# Patient Record
Sex: Female | Born: 1985 | Race: White | Hispanic: No | Marital: Married | Smoking: Current some day smoker
Health system: Southern US, Community
[De-identification: ages and names within clinical notes are randomized; demographics above are authoritative.]

## PROBLEM LIST (undated history)

## (undated) DIAGNOSIS — F329 Major depressive disorder, single episode, unspecified: Secondary | ICD-10-CM

## (undated) DIAGNOSIS — F32A Depression, unspecified: Secondary | ICD-10-CM

## (undated) DIAGNOSIS — F419 Anxiety disorder, unspecified: Secondary | ICD-10-CM

## (undated) DIAGNOSIS — G009 Bacterial meningitis, unspecified: Secondary | ICD-10-CM

## (undated) DIAGNOSIS — G43909 Migraine, unspecified, not intractable, without status migrainosus: Secondary | ICD-10-CM

## (undated) HISTORY — DX: Major depressive disorder, single episode, unspecified: F32.9

## (undated) HISTORY — DX: Depression, unspecified: F32.A

## (undated) HISTORY — DX: Anxiety disorder, unspecified: F41.9

## (undated) HISTORY — PX: WISDOM TOOTH EXTRACTION: SHX21

## (undated) HISTORY — PX: LUMBAR PUNCTURE: SHX1985

## (undated) HISTORY — PX: EPIDURAL BLOOD PATCH: SHX1517

## (undated) HISTORY — DX: Migraine, unspecified, not intractable, without status migrainosus: G43.909

## (undated) HISTORY — PX: PHARYNGECTOMY: SUR1024

## (undated) HISTORY — DX: Bacterial meningitis, unspecified: G00.9

---
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
## 2018-02-11 ENCOUNTER — Encounter: Payer: Self-pay | Admitting: *Deleted

## 2018-02-12 ENCOUNTER — Encounter: Payer: Self-pay | Admitting: Neurology

## 2018-02-12 ENCOUNTER — Ambulatory Visit: Payer: BLUE CROSS/BLUE SHIELD | Admitting: Neurology

## 2018-02-12 VITALS — BP 130/85 | HR 81 | Ht 64.0 in | Wt 189.0 lb

## 2018-02-12 DIAGNOSIS — R7309 Other abnormal glucose: Secondary | ICD-10-CM

## 2018-02-12 DIAGNOSIS — H5462 Unqualified visual loss, left eye, normal vision right eye: Secondary | ICD-10-CM | POA: Diagnosis not present

## 2018-02-12 DIAGNOSIS — E669 Obesity, unspecified: Secondary | ICD-10-CM | POA: Diagnosis not present

## 2018-02-12 DIAGNOSIS — G932 Benign intracranial hypertension: Secondary | ICD-10-CM | POA: Diagnosis not present

## 2018-02-12 DIAGNOSIS — R519 Headache, unspecified: Secondary | ICD-10-CM

## 2018-02-12 DIAGNOSIS — R51 Headache: Secondary | ICD-10-CM | POA: Diagnosis not present

## 2018-02-12 DIAGNOSIS — G8929 Other chronic pain: Secondary | ICD-10-CM

## 2018-02-12 DIAGNOSIS — H4710 Unspecified papilledema: Secondary | ICD-10-CM

## 2018-02-12 MED ORDER — HYDROXYZINE HCL 10 MG PO TABS: ORAL_TABLET | ORAL | 0 refills | Status: DC

## 2018-02-12 NOTE — Progress Notes (Signed)
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 female here as requested by Dr. Abel PrestoYoakum for visual field defect.  Past medical history migraine, depression, anxiety.She has frequent headaches and migraines since having meningitis at the age of 32. She started having more frequent headaches and went to the eye doctor. Aunt has migraines. The headaches can feel like pressure. They can be pulsating/pounding and she can have nausea, vomiting, light and sound may bother her or may not. She has daily headaches. She has pressure every day. She has migraine days at least 8 a month. She vomits often. Movement makes it worse. No IUD, no vit A, no antibiotic long term use. She is obese. The headaches can be severe. Wakes up with headaches, continuous, unclear triggers.   Reviewed notes, labs and imaging from outside physicians, which showed:  Reviewed notes from my eye doctor in [place].  Patient's been seen multiple times in the last several months.  Patient [facility] visual fields[facility].  No complaints reported of physical ocular symptoms.  Not experiencing routine headaches or double vision.  No reports of visual floaters or light flashes.  In addition not experiencing blurry or uncomfortable vision.  Patient does have a history of migraines.  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  She was referred to neurology.  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  Review of Systems: Patient complains of symptoms  per HPI as well as the following symptoms: Headache, depression, anxiety, loss of vision, eye pain, aching muscles, allergies, weight gain, fatigue. Pertinent negatives and positives per HPI. All others negative.   Social History   Socioeconomic History  . Marital status: Married    Spouse name: Not on file  . Number of children: 0  . Years of education: 5214  . Highest education level: Associate degree: occupational, Scientist, product/process developmenttechnical, or vocational program  Occupational History  . Not on file  Social Needs  . Financial resource strain: Not on file  . Food insecurity:    Worry: Not on file    Inability: Not on file  . Transportation needs:    Medical: Not on file    Non-medical: Not on file  Tobacco Use  . Smoking status: Current Some Day Smoker    Types: Cigarettes  . Smokeless tobacco: Never Used  . Tobacco comment: social/stress smoke  Substance and Sexual Activity  . Alcohol use: Yes    Comment: might have a glass of wine here or there. Stopped regular drinking in her 3420s.  . Drug use: Never  . Sexual activity: Not on file  Lifestyle  . Physical activity:    Days per week: Not on file    Minutes per session: Not on file  . Stress: Not on file  Relationships  . Social connections:    Talks on phone: Not on file    Gets together: Not on file    Attends religious service: Not on file    Active member of club or organization: Not on file    Attends meetings of clubs  or organizations: Not on file    Relationship status: Not on file  . Intimate partner violence:    Fear of current or ex partner: Not on file    Emotionally abused: Not on file    Physically abused: Not on file    Forced sexual activity: Not on file  Other Topics Concern  . Not on file  Social History Narrative   Lives at home with her husband   Right handed   Caffeine: coffee 3-4 cups per day    Family History  Problem Relation Age of Onset  . Glaucoma Other        women on paternal side  . Migraines  Paternal Aunt   . Pseudotumor cerebri Neg Hx     Past Medical History:  Diagnosis Date  . Anxiety   . Bacterial meningitis 2004  . Depression   . Migraine     Past Surgical History:  Procedure Laterality Date  . PHARYNGECTOMY     x2  . WISDOM TOOTH EXTRACTION      Current Outpatient Medications  Medication Sig Dispense Refill  . hydrOXYzine (ATARAX/VISTARIL) 10 MG tablet Take 1 tablet 30-60 minutes before procedure. May repeat once if needed. 10 tablet 0   No current facility-administered medications for this visit.     Allergies as of 02/12/2018 - Review Complete 02/12/2018  Allergen Reaction Noted  . Other  02/11/2018  . Valium [diazepam]  02/11/2018  . Penicillins Rash 02/12/2018    Vitals: BP 130/85 (BP Location: Right Arm, Patient Position: Sitting)   Pulse 81   Ht 5\' 4"  (1.626 m)   Wt 189 lb (85.7 kg)   LMP 02/08/2018 (Exact Date)   BMI 32.44 kg/m  Last Weight:  Wt Readings from Last 1 Encounters:  02/12/18 189 lb (85.7 kg)   Last Height:   Ht Readings from Last 1 Encounters:  02/12/18 5\' 4"  (1.626 m)   Physical exam: Exam: Gen: NAD, conversant, well nourised, obese, well groomed                     CV: RRR, no MRG. No Carotid Bruits. No peripheral edema, warm, nontender Eyes: Conjunctivae clear without exudates or hemorrhage  Neuro: Detailed Neurologic Exam  Speech:    Speech is normal; fluent and spontaneous with normal comprehension.  Cognition:    The patient is oriented to person, place, and time;     recent and remote memory intact;     language fluent;     normal attention, concentration,     fund of knowledge Cranial Nerves:    The pupils are equal, round, and reactive to light. +papilledema +1 Visual fields are full to finger confrontation. Extraocular movements are intact. Trigeminal sensation is intact and the muscles of mastication are normal. The face is symmetric. The palate elevates in the midline. Hearing intact. Voice is  normal. Shoulder shrug is normal. The tongue has normal motion without fasciculations.   Coordination:    Normal finger to nose and heel to shin. Normal rapid alternating movements.   Gait:    Heel-toe and tandem gait are normal.   Motor Observation:    No asymmetry, no atrophy, and no involuntary movements noted. Tone:    Normal muscle tone.    Posture:    Posture is normal. normal erect    Strength:    Strength is V/V in the upper and lower limbs.      Sensation: intact to LT  Reflex Exam:  DTR's:    Deep tendon reflexes in the upper and lower extremities are normal bilaterally.   Toes:    The toes are downgoing bilaterally.   Clonus:    Clonus is absent.     Assessment/Plan:  32 y/o with PMHx migraines, obesity, meningitis with papilledema. Need evaluation for IIH or other etiologies.   MRI brain w/wo contrast MRV of the brain Lumbar puncture Obesity: losing weight is critical. Healthy weight and wellness center Discussed IIH and risk of permanent vision loss, go to ED acutely if needed.   Orders Placed This Encounter  Procedures  . MR BRAIN W WO CONTRAST  . MR MRV HEAD WO CM  . DG FLUORO GUIDED LOC OF NEEDLE/CATH TIP FOR SPINAL INJECT LT  . Comprehensive metabolic panel  . CBC  . Thyroid Panel With TSH  . Hemoglobin A1c  . Ambulatory referral to Family Practice     Discussed: To prevent or relieve headaches, try the following: Cool Compress. Lie down and place a cool compress on your head.  Avoid headache triggers. If certain foods or odors seem to have triggered your migraines in the past, avoid them. A headache diary might help you identify triggers.  Include physical activity in your daily routine. Try a daily walk or other moderate aerobic exercise.  Manage stress. Find healthy ways to cope with the stressors, such as delegating tasks on your to-do list.  Practice relaxation techniques. Try deep breathing, yoga, massage and visualization.  Eat  regularly. Eating regularly scheduled meals and maintaining a healthy diet might help prevent headaches. Also, drink plenty of fluids.  Follow a regular sleep schedule. Sleep deprivation might contribute to headaches Consider biofeedback. With this mind-body technique, you learn to control certain bodily functions - such as muscle tension, heart rate and blood pressure - to prevent headaches or reduce headache pain.    Proceed to emergency room if you experience new or worsening symptoms or symptoms do not resolve, if you have new neurologic symptoms or if headache is severe, or for any concerning symptom.   Provided education and documentation from American headache Society toolbox including articles on: chronic migraine medication overuse headache, chronic migraines, prevention of migraines, behavioral and other nonpharmacologic treatments for headache.  Cc: Dr. Frederico Hamman, MD  [facility] [address] [place], [place] [address]  Phone [phone] Fax [phone]

## 2018-02-12 NOTE — Patient Instructions (Addendum)
MRI brain w/wo contrast MRV head  Lumbar puncture  Acetazolamide if IIH confirmed   Idiopathic Intracranial Hypertension Idiopathic intracranial hypertension (IIH) is a condition that increases pressure around the brain. The fluid that surrounds the brain and spinal cord (cerebrospinal fluid, CSF) increases and causes the pressure. Idiopathic means that the cause of this condition is not known. IIH affects the brain and spinal cord (is a neurological disorder). If this condition is not treated, it can cause vision loss or blindness. What increases the risk? You are more likely to develop this condition if:  You are severely overweight (obese).  You are a woman who has not gone through menopause.  You take certain medicines, such as birth control or steroids.  What are the signs or symptoms? Symptoms of IIH include:  Headaches. This is the most common symptom.  Pain in the shoulders or neck.  Nausea and vomiting.  A "rushing water" or pulsing sound within the ears (pulsatile tinnitus).  Double vision.  Blurred vision.  Brief episodes of complete vision loss.  How is this diagnosed? This condition may be diagnosed based on:  Your symptoms.  Your medical history.  CT scan of the brain.  MRI of the brain.  Magnetic resonance venogram (MRV) to check veins in the brain.  Diagnostic lumbar puncture. This is a procedure to remove and examine a sample of cerebrospinal fluid. This procedure can determine whether too much fluid may be causing IIH.  A thorough eye exam to check for swelling or nerve damage in the eyes.  How is this treated? Treatment for this condition depends on your symptoms. The goal of treatment is to decrease the pressure around your brain. Common treatments include:  Medicines to decrease the production of spinal fluid and lower the pressure within your skull.  Medicines to prevent or treat headaches.  Surgery to place drains (shunts) in your  brain to remove excess fluid.  Lumbar puncture to remove excess cerebrospinal fluid.  Follow these instructions at home:  If you are overweight or obese, work with your health care provider to lose weight.  Take over-the-counter and prescription medicines only as told by your health care provider.  Do not drive or use heavy machinery while taking medicines that can make you sleepy.  Keep all follow-up visits as told by your health care provider. This is important. Contact a health care provider if:  You have changes in your vision, such as: ? Double vision. ? Not being able to see colors (color vision). Get help right away if:  You have any of the following symptoms and they get worse or do not get better. ? Headaches. ? Nausea. ? Vomiting. ? Vision changes or difficulty seeing. Summary  Idiopathic intracranial hypertension (IIH) is a condition that increases pressure around the brain. The cause is not known (is idiopathic).  The most common symptom of IIH is headaches.  Treatment may include medicines or surgery to relieve the pressure on your brain. This information is not intended to replace advice given to you by your health care provider. Make sure you discuss any questions you have with your health care provider. Document Released: 04/30/2001 Document Revised: 01/11/2016 Document Reviewed: 01/11/2016 Elsevier Interactive Patient Education  2017 Elsevier Inc.  Acetazolamide tablets What is this medicine? ACETAZOLAMIDE (a set a ZOLE a mide) is used to treat glaucoma and some seizure disorders. It may be used to treat edema or swelling from heart failure or from other medicines. This medicine is also  used to treat and to prevent altitude or mountain sickness. This medicine may be used for other purposes; ask your health care provider or pharmacist if you have questions. COMMON BRAND NAME(S): Diamox What should I tell my health care provider before I take this  medicine? They need to know if you have any of these conditions: -diabetes -kidney disease -liver disease -lung disease -an unusual or allergic reaction to acetazolamide, sulfa drugs, other medicines, foods, dyes, or preservatives -pregnant or trying to get pregnant -breast-feeding How should I use this medicine? Take this medicine by mouth with a glass of water. Follow the directions on the prescription label. Take this medicine with food if it upsets your stomach. Take your doses at regular intervals. Do not take your medicine more often than directed. Do not stop taking except on your doctor's advice. Talk to your pediatrician regarding the use of this medicine in children. Special care may be needed. Patients over 69 years old may have a stronger reaction and need a smaller dose. Overdosage: If you think you have taken too much of this medicine contact a poison control center or emergency room at once. NOTE: This medicine is only for you. Do not share this medicine with others. What if I miss a dose? If you miss a dose, take it as soon as you can. If it is almost time for your next dose, take only that dose. Do not take double or extra doses. What may interact with this medicine? Do not take this medicine with any of the following medications: -methazolamide This medicine may also interact with the following medications: -aspirin and aspirin-like medicines -cyclosporine -lithium -medicine for diabetes -methenamine -other diuretics -phenytoin -primidone -quinidine -sodium bicarbonate -stimulant medicines like dextroamphetamine This list may not describe all possible interactions. Give your health care provider a list of all the medicines, herbs, non-prescription drugs, or dietary supplements you use. Also tell them if you smoke, drink alcohol, or use illegal drugs. Some items may interact with your medicine. What should I watch for while using this medicine? Visit your doctor or  health care professional for regular checks on your progress. You will need blood work done regularly. If you are diabetic, check your blood sugar as directed. You may need to be on a special diet while taking this medicine. Ask your doctor. Also, ask how many glasses of fluid you need to drink a day. You must not get dehydrated. You may get drowsy or dizzy. Do not drive, use machinery, or do anything that needs mental alertness until you know how this medicine affects you. Do not stand or sit up quickly, especially if you are an older patient. This reduces the risk of dizzy or fainting spells. This medicine can make you more sensitive to the sun. Keep out of the sun. If you cannot avoid being in the sun, wear protective clothing and use sunscreen. Do not use sun lamps or tanning beds/booths. What side effects may I notice from receiving this medicine? Side effects that you should report to your doctor or health care professional as soon as possible: -allergic reactions like skin rash, itching or hives, swelling of the face, lips, or tongue -breathing problems -confusion, depression -dark urine -fever -numbness, tingling in hands or feet -redness, blistering, peeling or loosening of the skin, including inside the mouth -ringing in the ears -seizures -unusually weak or tired -yellowing of the eyes or skin Side effects that usually do not require medical attention (report to your doctor or  health care professional if they continue or are bothersome): -change in taste -diarrhea -headache -loss of appetite -nausea, vomiting -passing urine more often This list may not describe all possible side effects. Call your doctor for medical advice about side effects. You may report side effects to FDA at 1-800-FDA-1088. Where should I keep my medicine? Keep out of the reach of children. Store at room temperature between 20 and 25 degrees C (68 and 77 degrees F). Throw away any unused medicine after the  expiration date. NOTE: This sheet is a summary. It may not cover all possible information. If you have questions about this medicine, talk to your doctor, pharmacist, or health care provider.  2018 Elsevier/Gold Standard (2007-05-14 10:59:40)   Migraine Headache A migraine headache is a very strong throbbing pain on one side or both sides of your head. Migraines can also cause other symptoms. Talk with your doctor about what things may bring on (trigger) your migraine headaches. Follow these instructions at home: Medicines  Take over-the-counter and prescription medicines only as told by your doctor.  Do not drive or use heavy machinery while taking prescription pain medicine.  To prevent or treat constipation while you are taking prescription pain medicine, your doctor may recommend that you: ? Drink enough fluid to keep your pee (urine) clear or pale yellow. ? Take over-the-counter or prescription medicines. ? Eat foods that are high in fiber. These include fresh fruits and vegetables, whole grains, and beans. ? Limit foods that are high in fat and processed sugars. These include fried and sweet foods. Lifestyle  Avoid alcohol.  Do not use any products that contain nicotine or tobacco, such as cigarettes and e-cigarettes. If you need help quitting, ask your doctor.  Get at least 8 hours of sleep every night.  Limit your stress. General instructions   Keep a journal to find out what may bring on your migraines. For example, write down: ? What you eat and drink. ? How much sleep you get. ? Any change in what you eat or drink. ? Any change in your medicines.  If you have a migraine: ? Avoid things that make your symptoms worse, such as bright lights. ? It may help to lie down in a dark, quiet room. ? Do not drive or use heavy machinery. ? Ask your doctor what activities are safe for you.  Keep all follow-up visits as told by your doctor. This is important. Contact a doctor  if:  You get a migraine that is different or worse than your usual migraines. Get help right away if:  Your migraine gets very bad.  You have a fever.  You have a stiff neck.  You have trouble seeing.  Your muscles feel weak or like you cannot control them.  You start to lose your balance a lot.  You start to have trouble walking.  You pass out (faint). This information is not intended to replace advice given to you by your health care provider. Make sure you discuss any questions you have with your health care provider. Document Released: 11/29/2007 Document Revised: 09/09/2015 Document Reviewed: 08/08/2015 Elsevier Interactive Patient Education  2018 [employer].

## 2018-02-13 ENCOUNTER — Encounter: Payer: Self-pay | Admitting: Neurology

## 2018-02-13 ENCOUNTER — Telehealth: Payer: Self-pay | Admitting: *Deleted

## 2018-02-13 ENCOUNTER — Telehealth: Payer: Self-pay | Admitting: Neurology

## 2018-02-13 DIAGNOSIS — R51 Headache: Secondary | ICD-10-CM

## 2018-02-13 DIAGNOSIS — H4710 Unspecified papilledema: Secondary | ICD-10-CM | POA: Insufficient documentation

## 2018-02-13 DIAGNOSIS — R519 Headache, unspecified: Secondary | ICD-10-CM | POA: Insufficient documentation

## 2018-02-13 LAB — COMPREHENSIVE METABOLIC PANEL
ALT: 15 IU/L (ref 0–32)
AST: 16 IU/L (ref 0–40)
Albumin/Globulin Ratio: 1.7 (ref 1.2–2.2)
Albumin: 4.5 g/dL (ref 3.5–5.5)
Alkaline Phosphatase: 76 IU/L (ref 39–117)
BUN/Creatinine Ratio: 11 (ref 9–23)
BUN: 8 mg/dL (ref 6–20)
CO2: 20 mmol/L (ref 20–29)
CREATININE: 0.73 mg/dL (ref 0.57–1.00)
Calcium: 9.6 mg/dL (ref 8.7–10.2)
Chloride: 106 mmol/L (ref 96–106)
GFR calc non Af Amer: 110 mL/min/{1.73_m2} (ref >59)
GFR, EST AFRICAN AMERICAN: 127 mL/min/{1.73_m2} (ref >59)
Globulin, Total: 2.6 g/dL (ref 1.5–4.5)
Glucose: 80 mg/dL (ref 65–99)
Potassium: 4.7 mmol/L (ref 3.5–5.2)
Sodium: 141 mmol/L (ref 134–144)
Total Protein: 7.1 g/dL (ref 6.0–8.5)

## 2018-02-13 LAB — CBC
Hematocrit: 44.6 % (ref 34.0–46.6)
Hemoglobin: 14.6 g/dL (ref 11.1–15.9)
MCH: 27.8 pg (ref 26.6–33.0)
MCHC: 32.7 g/dL (ref 31.5–35.7)
MCV: 85 fL (ref 79–97)
Platelets: 292 10*3/uL (ref 150–450)
RBC: 5.25 x10E6/uL (ref 3.77–5.28)
RDW: 12.5 % (ref 12.3–15.4)
WBC: 5.8 10*3/uL (ref 3.4–10.8)

## 2018-02-13 LAB — HEMOGLOBIN A1C
Est. average glucose Bld gHb Est-mCnc: 114 mg/dL
Hgb A1c MFr Bld: 5.6 % (ref 4.8–5.6)

## 2018-02-13 LAB — THYROID PANEL WITH TSH
Free Thyroxine Index: 2.1 (ref 1.2–4.9)
T3 Uptake Ratio: 27 % (ref 24–39)
T4, Total: 7.6 ug/dL (ref 4.5–12.0)
TSH: 2.09 u[IU]/mL (ref 0.450–4.500)

## 2018-02-13 NOTE — Telephone Encounter (Signed)
[employer] Auth: [id number] (exp. 02/13/18 to 03/14/18) patient is scheduled at GI for 02/27/18.

## 2018-02-13 NOTE — Telephone Encounter (Signed)
Spoke with patient and informed her that her lab results are normal. Patient verbalized understanding, appreciation.

## 2018-02-17 NOTE — Telephone Encounter (Signed)
Patient is scheduled at GI for 02/12/18

## 2018-02-27 ENCOUNTER — Ambulatory Visit
Admission: RE | Admit: 2018-02-27 | Discharge: 2018-02-27 | Disposition: A | Discharge status: Home / Self Care | Payer: BLUE CROSS/BLUE SHIELD | Source: Ambulatory Visit | Attending: Neurology | Admitting: Neurology

## 2018-02-27 DIAGNOSIS — H4710 Unspecified papilledema: Secondary | ICD-10-CM

## 2018-02-27 DIAGNOSIS — G8929 Other chronic pain: Secondary | ICD-10-CM

## 2018-02-27 DIAGNOSIS — R51 Headache: Secondary | ICD-10-CM

## 2018-02-27 DIAGNOSIS — H5462 Unqualified visual loss, left eye, normal vision right eye: Secondary | ICD-10-CM

## 2018-02-27 DIAGNOSIS — R519 Headache, unspecified: Secondary | ICD-10-CM

## 2018-02-27 MED ORDER — GADOBENATE DIMEGLUMINE 529 MG/ML IV SOLN
17.0000 mL | Freq: Once | INTRAVENOUS | Status: AC | PRN
  Administered 2018-02-27: 17 mL via INTRAVENOUS

## 2018-02-28 ENCOUNTER — Ambulatory Visit
Admission: RE | Admit: 2018-02-28 | Discharge: 2018-02-28 | Disposition: A | Discharge status: Home / Self Care | Payer: BLUE CROSS/BLUE SHIELD | Source: Ambulatory Visit | Attending: Neurology | Admitting: Neurology

## 2018-02-28 DIAGNOSIS — R51 Headache: Principal | ICD-10-CM

## 2018-02-28 DIAGNOSIS — G8929 Other chronic pain: Secondary | ICD-10-CM

## 2018-02-28 DIAGNOSIS — R519 Headache, unspecified: Secondary | ICD-10-CM

## 2018-02-28 DIAGNOSIS — H5462 Unqualified visual loss, left eye, normal vision right eye: Secondary | ICD-10-CM

## 2018-02-28 DIAGNOSIS — G932 Benign intracranial hypertension: Secondary | ICD-10-CM

## 2018-02-28 DIAGNOSIS — E669 Obesity, unspecified: Secondary | ICD-10-CM

## 2018-02-28 DIAGNOSIS — H4710 Unspecified papilledema: Secondary | ICD-10-CM

## 2018-02-28 LAB — GLUCOSE, CSF: Glucose, CSF: 67 mg/dL (ref 40–80)

## 2018-02-28 LAB — GRAM STAIN
MICRO NUMBER: 91544736
SPECIMEN QUALITY:: ADEQUATE

## 2018-02-28 LAB — CSF CELL COUNT WITH DIFFERENTIAL
RBC Count, CSF: 1 cells/uL (ref 0–10)
WBC, CSF: 1 cells/uL (ref 0–5)

## 2018-02-28 LAB — PROTEIN, CSF: TOTAL PROTEIN, CSF: 19 mg/dL (ref 15–45)

## 2018-02-28 NOTE — Discharge Instructions (Signed)

## 2018-03-03 ENCOUNTER — Telehealth: Payer: Self-pay | Admitting: *Deleted

## 2018-03-03 ENCOUNTER — Other Ambulatory Visit: Payer: Self-pay | Admitting: *Deleted

## 2018-03-03 ENCOUNTER — Telehealth: Payer: Self-pay | Admitting: Neurology

## 2018-03-03 DIAGNOSIS — G971 Other reaction to spinal and lumbar puncture: Secondary | ICD-10-CM

## 2018-03-03 NOTE — Telephone Encounter (Signed)
Spoke with patient and advised her that Dr. Frances FurbishAthar has authorized an order for a blood patch and this was placed. Advised pt to call GI asap to get on the schedule. Informed her that if she is unable to get it and she gets worse to go to the ED as they can do it in the hospital if needed. Advised pt to call us back if needed and to also please call when she is better so we can order the Diamox. Pt verbalized appreciation and understanding.

## 2018-03-03 NOTE — Progress Notes (Signed)
Blood patch order.

## 2018-03-03 NOTE — Telephone Encounter (Signed)
Labs on her spinal fluid were unremarkable, her pressure was indeed elevated at 33. She can start the Diamox when her nausea and vomiting are better. We can try Zofran for now, but if she does have a significant positional component and headache with standing up she may need a blood patch at this point. [facility].  Please advise patient that she could try nausea medicine or if she has severe symptoms we may want to go ahead with a blood patch.

## 2018-03-03 NOTE — Telephone Encounter (Signed)
Spoke with patient and discussed the recommendations from Dr. Frances FurbishAthar. Pt stated that she doesn't think the nausea medication would work. She said she is not feeling sick to her stomach but she when she gets up and moves around she gets sick. Pt said that she doesn't want to but if she needs the blood patch then she is fine with that. She is aware that if Dr. Frances FurbishAthar agrees to order the blood patch pt can call GI and ask to be scheduled for the blood patch. She verbalized appreciation and will wait for call back from our office with next steps.   Also, pt doesn't have a prescription for the diamox yet.

## 2018-03-03 NOTE — Telephone Encounter (Signed)
Called pt and discussed results. Informed pt that her MRV showed no blockage in the veins of the head. There was some narrowing of the veins in the back of that head that sometimes occurs if spinal fluid pressure is high (which we confirmed on the LP that she had after the MRI). Advised pt that her MRI of the brain showed some flattening of the pituitary gland that sometimes happens when spinal fluid pressure is high.   She also has some mild chronic sinusitis and some fluid in the mastoids (near the left ear). Advised pt that if  she has a stopped up sensation in her left ear she can try OTC nasal Flonase which sometimes helps reduce the small amount of fluid. Pt stated that has that sensation in the morning but it "pops open" during the day. RN advised pt that we can call in some Azithromycin if her sinuses are bothering her. She said she would like to hold off on that.  Patient verbalized understanding and appreciation for the results and advice. She had no further concerns. Her blood patch will be tomorrow. She is aware to please call back when she is better from her post-LP symptoms of headache and nausea and we will start Diamox.    --------------------- Notes recorded by Asa LenteSater, Richard A, MD on 02/28/2018 at 12:11 PM EST Please let her know that there is no blockage in the veins of the head. There was some narrowing of the veins in the back of the head that sometimes occurs if spinal fluid pressure is high. She is having a lumbar puncture today to determine if the pressure is elevated.  Notes recorded by Dr. 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   She also has some mild chronic sinusitis and some fluid in the mastoids (near the left ear).  Please call in azithromycin 250 mg tablets #6.  Take 2 p.o. the first day then 1 p.o. qd for more days until finished. If she has a  stopped up sensation in her left ear she can try OTC nasal Flonase which sometimes helps reduce the small amount of fluid.

## 2018-03-03 NOTE — Telephone Encounter (Signed)
Spoke with Dr. Frances FurbishAthar as Dr. Lucia GaskinsAhern is currently out of the office. Dr. Frances FurbishAthar recommends that pt increase her oral intake, drink caffeine (strong coffee, diet soda) and continue bedrest. Hold on starting Diamox for now. Possibly start in a few days as pressure was elevated at 33. Will have pt call back on Thursday if doing well or can call tomorrow if not better and speak with on-call MD.   Sherron MondaySpoke with pt and informed of the above plan. Patient verbalized understanding and stated that she had already increased her oral intake and has been drinking coffee. However when she gets up to go to the bathroom she gets sick and is unable to keep the fluids down. Pt stated that she does have a headache however it is not throbbing. RN advised that she would let Dr. Frances FurbishAthar know and call her back. Pt appreciative.

## 2018-03-03 NOTE — Telephone Encounter (Signed)
-----   Message from Judi Congasey C Bruno, RN sent at 03/03/2018  5:35 PM EST -----  ----- Message ----- From: Judi CongBruno, Casey C, RN Sent: 03/03/2018   2:51 PM EST To: Bertram SavinBethany L Leib Elahi, RN   ----- Message ----- From: Asa LenteSater, Richard A, MD Sent: 02/28/2018  12:18 PM EST To: Judi Congasey C Bruno, RN  Please let her know that the MRI of the brain showed some flattening of the pituitary gland that sometimes happens when spinal fluid pressure is high.   She also has some mild chronic sinusitis and some fluid in the mastoids (near the left ear).  Please call in azithromycin 250 mg tablets #6.  Take 2 p.o. the first day then 1 p.o. qd for more days until finished. If she has a stopped up sensation in her left ear she can try OTC nasal Flonase which sometimes helps reduce the small amount of fluid.

## 2018-03-03 NOTE — Telephone Encounter (Signed)
Pt said since spinal tap on Friday 02/28/18 she has been n&v with HA. She is unable to perform any daily activities. As long as lying down she is ok. Please call to advise

## 2018-03-04 ENCOUNTER — Ambulatory Visit
Admission: RE | Admit: 2018-03-04 | Discharge: 2018-03-04 | Disposition: A | Discharge status: Home / Self Care | Payer: BLUE CROSS/BLUE SHIELD | Source: Ambulatory Visit | Attending: Neurology | Admitting: Neurology

## 2018-03-04 DIAGNOSIS — G971 Other reaction to spinal and lumbar puncture: Secondary | ICD-10-CM

## 2018-03-04 MED ORDER — IOPAMIDOL (ISOVUE-M 200) INJECTION 41%
1.0000 mL | Freq: Once | INTRAMUSCULAR | Status: AC
  Administered 2018-03-04: 1 mL via EPIDURAL

## 2018-03-04 NOTE — Progress Notes (Signed)
Blood obtained from pt's R AC for blood patch. Pt tolerated procedure well. Site is unremarkable. 

## 2018-03-04 NOTE — Discharge Instructions (Signed)

## 2018-03-07 NOTE — Telephone Encounter (Signed)
Pt called to inform RN that she is doing better just has a little bit of nausea left. She is ready to start medications for her spinal pressures. Please advise.

## 2018-03-10 NOTE — Addendum Note (Signed)
Addended by: Guy Begin on: 03/10/2018 03:40 PM   Modules accepted: Orders

## 2018-03-10 NOTE — Telephone Encounter (Signed)
Side Effects. Dizziness, lightheadedness, or increased urination may occur, especially during the first few days as your body adjusts to the medication. Blurred vision, dry mouth, drowsiness, loss of appetite, stomach upset, headache and tiredness may also occur.

## 2018-03-10 NOTE — Telephone Encounter (Signed)
Spoke to pt and she stated that she is taking OTC NSAid ibuprofen and ice for back pain,has sharp shooting pains  sits up. Asking for non-narcotic wo help with this.  Blood patch done last Tuesday.  Sx of light headedness, nausea, vomiting has gotten better.  Ok to start generic diamox.   Went over side effects. Dizziness, lightheadedness, or increased urination may occur, especially during the first few days as your body adjusts to the medication. Blurred vision, dry mouth, drowsiness, loss of appetite, stomach upset, headache and tiredness may also occur.

## 2018-03-10 NOTE — Telephone Encounter (Signed)
Ok to start diamox?

## 2018-03-10 NOTE — Telephone Encounter (Signed)
Pt has called back because she states she was not contacted by RN

## 2018-03-10 NOTE — Telephone Encounter (Signed)
Recommend ibuprofen or tylenol for now. -VRP

## 2018-03-10 NOTE — Telephone Encounter (Signed)
Ok to start acetazolamide 250mg  twice a day. Will increase after 1-2 weeks. -VRP

## 2018-03-11 MED ORDER — ACETAZOLAMIDE 250 MG PO TABS: 250.0000 mg | ORAL_TABLET | Freq: Two times a day (BID) | ORAL | 0 refills | Status: DC

## 2018-03-11 NOTE — Addendum Note (Signed)
Addended by: Guy Begin on: 03/11/2018 09:06 AM   Modules accepted: Orders

## 2018-03-11 NOTE — Telephone Encounter (Signed)
Spoke to pt and relayed that per Dr. Marjory Lies continue with tylenol and motrin for pain.  She states that she can not sit for extended time, only 2-3 hours at at time at work the it becomes unbearable.  She will call [facility] about using heat instead of ice on LP site now.  She picked up diamox 250mg  po bid today and will start this.  She is still asking for other type nsaid (non narcotic ) for her back pain.  Is there something you recommend?

## 2018-03-11 NOTE — Telephone Encounter (Signed)
Laura Freeman, we really need to take a look at her back and make sure there is no infection as it is unusual to have this much pain. Does Eber Jones or Aundra Millet have an open appointment this week?

## 2018-03-11 NOTE — Telephone Encounter (Signed)
If she is still having back pain after the LP or signs of LP headache please discuss with patient and call over to [facility] to see what we can do, would they want to see her to ensure the LP site isn;t infected or would they want to try another blood patch? Let me know thanks.

## 2018-03-12 ENCOUNTER — Telehealth: Payer: Self-pay | Admitting: Nurse Practitioner

## 2018-03-12 ENCOUNTER — Encounter: Payer: Self-pay | Admitting: Nurse Practitioner

## 2018-03-12 ENCOUNTER — Ambulatory Visit (INDEPENDENT_AMBULATORY_CARE_PROVIDER_SITE_OTHER): Payer: BLUE CROSS/BLUE SHIELD | Admitting: Nurse Practitioner

## 2018-03-12 VITALS — BP 149/90 | HR 99 | Ht 64.0 in | Wt 187.8 lb

## 2018-03-12 DIAGNOSIS — M545 Low back pain, unspecified: Secondary | ICD-10-CM

## 2018-03-12 DIAGNOSIS — M549 Dorsalgia, unspecified: Secondary | ICD-10-CM | POA: Insufficient documentation

## 2018-03-12 DIAGNOSIS — R519 Headache, unspecified: Secondary | ICD-10-CM

## 2018-03-12 DIAGNOSIS — H4710 Unspecified papilledema: Secondary | ICD-10-CM

## 2018-03-12 DIAGNOSIS — R51 Headache: Secondary | ICD-10-CM

## 2018-03-12 DIAGNOSIS — R269 Unspecified abnormalities of gait and mobility: Secondary | ICD-10-CM | POA: Insufficient documentation

## 2018-03-12 MED ORDER — PREDNISONE 10 MG PO TABS: 10.0000 mg | ORAL_TABLET | Freq: Every day | ORAL | 0 refills | Status: DC

## 2018-03-12 NOTE — Telephone Encounter (Signed)
Spoke to pt this am, per Dr. Lucia Gaskins, make appt with NP, back pain after 12/27, 12/31 LP/Blood patch.  This is 3 wks reck, has appt 03/18/18 with Dr. Lucia Gaskins, may need to be canceled after appt today.

## 2018-03-12 NOTE — Patient Instructions (Signed)
Continue Diamox 250 mg 2 times daily Prednisone 6-day Dosepak take as directed MRI of the lumbar spine rule out spinal leak hematoma constant back pain after LP gait abnormality Follow-up with Dr. Lucia Gaskins

## 2018-03-12 NOTE — Progress Notes (Signed)
[facility]  PATIENT: Laura Freeman DOB: 07-Aug-1985   REASON FOR VISIT: follow up for back pain, papilledema left eye HISTORY FROM:patient and husband     HISTORY OF PRESENT ILLNESS:UPDATE 1/8/2020CM Ms. 32, 33 year old female returns for follow-up.  She is originally evaluated by Dr. Lucia Gaskins 02/12/2018 for chronic daily headaches and papilledema.  She had an LP with opening pressure of 33 on 02/28/2018.  She immediately began to complain with back pain and had blood patch on 03/04/2018.  She has used ice to the area but ibuprofen does not help she states her back pain is constant.  She is having difficulty walking.  She has difficulty sitting up she feels better laying down.  She denies loss of bowel or bladder control.  She was just started on Diamox currently taking 250 mg twice a day.  She returns for reevaluation.  12/11/19[name] is a 33 y.o. female here as requested by Dr. Abel Presto for visual field defect.  Past medical history migraine, depression, anxiety.She has frequent headaches and migraines since having meningitis at the age of 77. She started having more frequent headaches and went to the eye doctor. Aunt has migraines. The headaches can feel like pressure. They can be pulsating/pounding and she can have nausea, vomiting, light and sound may bother her or may not. She has daily headaches. She has pressure every day. She has migraine days at least 8 a month. She vomits often. Movement makes it worse. No IUD, no vit A, no antibiotic long term use. She is obese. The headaches can be severe. Wakes up with headaches, continuous, unclear triggers.   Reviewed notes, labs and imaging from outside physicians, which showed:  Reviewed notes from my eye doctor in [place].  Patient's been seen multiple times in the last several months.  Patient last return for visual fields, OCT NP [facility] secondary suspicion of glaucoma.  No complaints reported of physical  ocular symptoms.  Not experiencing routine headaches or double vision.  No reports of visual floaters or light flashes.  In addition not experiencing blurry or uncomfortable vision.  Patient does have a history of migraines.  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  She was referred to neurology.  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   REVIEW OF SYSTEMS: Full 14 system review of systems performed and notable only for those listed, all others are neg:  Constitutional: neg  Cardiovascular: neg Ear/Nose/Throat: neg  Skin: neg Eyes: neg Respiratory: neg Gastroitestinal: neg  Hematology/Lymphatic: neg  Endocrine: neg Musculoskeletal:neg Allergy/Immunology: neg Neurological: Back pain Psychiatric: neg Sleep : neg   ALLERGIES: Allergies  Allergen Reactions  . Other     CILLINS   . Valium [Diazepam]     Panic attacks  . Penicillins Rash    Childhood    HOME MEDICATIONS: Outpatient Medications Prior to Visit  Medication Sig Dispense Refill  . acetaZOLAMIDE (DIAMOX) 250 MG tablet Take 1 tablet (250 mg total) by mouth 2 (two) times daily. 60 tablet 0  . hydrOXYzine (ATARAX/VISTARIL) 10 MG tablet Take 1 tablet 30-60 minutes before procedure. May repeat once if needed. 10 tablet 0  . methylPREDNISolone (MEDROL DOSEPAK) 4 MG TBPK tablet Take by mouth.    . predniSONE (DELTASONE) 10 MG tablet Take 10 mg by  mouth daily with breakfast. 6 day dose pack     No facility-administered medications prior to visit.     PAST MEDICAL HISTORY: Past Medical History:  Diagnosis Date  . Anxiety   . Bacterial meningitis 2004  . Depression   . Migraine     PAST SURGICAL HISTORY: Past Surgical History:  Procedure Laterality  Date  . PHARYNGECTOMY     x2  . WISDOM TOOTH EXTRACTION      FAMILY HISTORY: Family History  Problem Relation Age of Onset  . Glaucoma Other        women on paternal side  . Migraines Paternal Aunt   . Pseudotumor cerebri Neg Hx     SOCIAL HISTORY: Social History   Socioeconomic History  . Marital status: Married    Spouse name: Not on file  . Number of children: 0  . Years of education: 4214  . Highest education level: Associate degree: occupational, Scientist, product/process developmenttechnical, or vocational program  Occupational History  . Not on file  Social Needs  . Financial resource strain: Not on file  . Food insecurity:    Worry: Not on file    Inability: Not on file  . Transportation needs:    Medical: Not on file    Non-medical: Not on file  Tobacco Use  . Smoking status: Current Some Day Smoker    Types: Cigarettes  . Smokeless tobacco: Never Used  . Tobacco comment: social/stress smoke  Substance and Sexual Activity  . Alcohol use: Yes    Comment: might have a glass of wine here or there. Stopped regular drinking in her 4520s.  . Drug use: Never  . Sexual activity: Not on file  Lifestyle  . Physical activity:    Days per week: Not on file    Minutes per session: Not on file  . Stress: Not on file  Relationships  . Social connections:    Talks on phone: Not on file    Gets together: Not on file    Attends religious service: Not on file    Active member of club or organization: Not on file    Attends meetings of clubs or organizations: Not on file    Relationship status: Not on file  . Intimate partner violence:    Fear of current or ex partner: Not on file    Emotionally abused: Not on file    Physically abused: Not on file    Forced sexual activity: Not on file  Other Topics Concern  . Not on file  Social History Narrative   Lives at home with her husband   Right handed   Caffeine: coffee 3-4 cups per day     PHYSICAL EXAM  Vitals:   03/12/18 0957  BP: (!) 149/90    Pulse: 99  Weight: 187 lb 12.8 oz (85.2 kg)  Height: 5\' 4"  (1.626 m)   Body mass index is 32.24 kg/m.  Generalized: Well developed, obese femalein  no acute distress  Head: normocephalic and atraumatic,. Oropharynx benign  Neck: Supple,   Musculoskeletal: No deformity  Skin no rash or edema, no signs of infection on lower back Neurological examination   Mentation: Alert oriented to time, place, history taking. Attention span and concentration appropriate. Recent and remote memory intact.  Follows all commands speech and language fluent.   Cranial nerve II-XII: Fundoscopic exam reveals sharp disc margin on the right papilledema on the left Pupils were equal round reactive to light extraocular movements were full, visual  field were full on confrontational test. Facial sensation and strength were normal. hearing was intact to finger rubbing bilaterally. Uvula tongue midline. head turning and shoulder shrug were normal and symmetric.Tongue protrusion into cheek strength was normal. Motor: normal bulk and tone, full strength in the BUE, weakness  BLE, hip flexors, leg raising positive for back pain  Sensory: normal and symmetric to light touch, pinprick, and  Vibration, in the upper and lower extremities Coordination: finger-nose-finger,  no dysmetria Reflexes: Symmetric upper and lower, plantar responses were flexor bilaterally. Gait and Station: Rising up from seated position without assistance, wide-based unsteady gait , patient has a limp unable to tandem.  DIAGNOSTIC DATA (LABS, IMAGING, TESTING) - I reviewed patient records, labs, notes, testing and imaging myself where available.  Lab Results  Component Value Date   WBC 5.8 02/12/2018   HGB 14.6 02/12/2018   HCT 44.6 02/12/2018   MCV 85 02/12/2018   PLT 292 02/12/2018      Component Value Date/Time   NA 141 02/12/2018 0904   K 4.7 02/12/2018 0904   CL 106 02/12/2018 0904   CO2 20 02/12/2018 0904   GLUCOSE 80 02/12/2018  0904   BUN 8 02/12/2018 0904   CREATININE 0.73 02/12/2018 0904   CALCIUM 9.6 02/12/2018 0904   PROT 7.1 02/12/2018 0904   ALBUMIN 4.5 02/12/2018 0904   AST 16 02/12/2018 0904   ALT 15 02/12/2018 0904   ALKPHOS 76 02/12/2018 0904   BILITOT <0.2 02/12/2018 0904   GFRNONAA 110 02/12/2018 0904   GFRAA 127 02/12/2018 0904    Lab Results  Component Value Date   HGBA1C 5.6 02/12/2018    Lab Results  Component Value Date   TSH 2.090 02/12/2018      ASSESSMENT AND PLAN  33 y.o. year old female  has a past medical history of Anxiety, Bacterial meningitis (2004), Depression, and Migraine. 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  No benefit from blood patch.  Discussed with Dr. Lucia Gaskins Continue Diamox 250 mg 2 times daily Prednisone 6-day Dosepak take as directed MRI of the lumbar spine rule out spinal leak, hematoma constant back pain after LP gait abnormality rule out infection  follow-up with Dr. Haynes Bast, [facility], [facility], APRN  [facility] [address] [place], [place] [address] [phone]

## 2018-03-12 NOTE — Progress Notes (Signed)
Agree with history, physical, neuro exam,assessment and plan as above.     Naomie Dean, MD [facility]

## 2018-03-12 NOTE — Telephone Encounter (Signed)
[facility] [phone]  has 2 different set of directions WI:OMBTDHRCBU (DELTASONE) 10 MG tablet , Pharmacist Chip Boer is asking for a call back

## 2018-03-13 ENCOUNTER — Telehealth: Payer: Self-pay | Admitting: Neurology

## 2018-03-13 ENCOUNTER — Telehealth: Payer: Self-pay | Admitting: Nurse Practitioner

## 2018-03-13 NOTE — Telephone Encounter (Signed)
Spoke with patient and informed her that the pharmacy called late yesterday for clarification to the prescription. Advised her I called them but no answer. Advised her I will call them when they open at 9 am today to give clarification, so they can fill her prescription. She verbalized understanding, appreciation.

## 2018-03-13 NOTE — Telephone Encounter (Addendum)
Called [facility] earlier; pharmacy not open. Called again, spoke with [facility] and gave her NP's exact directions. She verbalized understanding, appreciation.

## 2018-03-13 NOTE — Telephone Encounter (Signed)
Patient stated that she went to the pharmacy about 3-4 hours after her visit yesterday and they informed her they never received the prescription for prednisone.

## 2018-03-13 NOTE — Telephone Encounter (Signed)
[employer] Auth: [id number] (exp. 03/13/18 to 04/11/18)   Just an FYI I spoke to the patient and I have her scheduled for 03/18/18 at [facility] for her MRI. I informed her she will not be able to see Dr. Lucia Gaskins on the same day as the MRI due to insurance purpose.. I informed her the appointment with Dr. Lucia Gaskins will be canceled and that they will give her a call with the results and go from there.

## 2018-03-13 NOTE — Addendum Note (Signed)
Addended by: [place] PinkHESSON, Annayah Worthley C on: 03/13/2018 09:21 AM   Modules accepted: Orders

## 2018-03-13 NOTE — Telephone Encounter (Signed)
In the comments for the dose it says 6 day dose pack that means 6 first day 5 the next day and continuing to decrease by 1 tab daily .

## 2018-03-13 NOTE — Telephone Encounter (Signed)
Noted thanks °

## 2018-03-18 ENCOUNTER — Ambulatory Visit (INDEPENDENT_AMBULATORY_CARE_PROVIDER_SITE_OTHER): Payer: BLUE CROSS/BLUE SHIELD

## 2018-03-18 ENCOUNTER — Ambulatory Visit: Payer: Self-pay | Admitting: Neurology

## 2018-03-18 ENCOUNTER — Telehealth: Payer: Self-pay | Admitting: Neurology

## 2018-03-18 DIAGNOSIS — R519 Headache, unspecified: Secondary | ICD-10-CM

## 2018-03-18 DIAGNOSIS — R269 Unspecified abnormalities of gait and mobility: Secondary | ICD-10-CM | POA: Diagnosis not present

## 2018-03-18 DIAGNOSIS — M545 Low back pain, unspecified: Secondary | ICD-10-CM

## 2018-03-18 DIAGNOSIS — R51 Headache: Secondary | ICD-10-CM

## 2018-03-18 MED ORDER — GADOBENATE DIMEGLUMINE 529 MG/ML IV SOLN
15.0000 mL | Freq: Once | INTRAVENOUS | Status: AC | PRN
  Administered 2018-03-18: 15 mL via INTRAVENOUS

## 2018-03-18 NOTE — Telephone Encounter (Signed)
The MRI was completed today.  I called to let her know the results are not available yet but she will receive a phone call from our office, once the scan is read.

## 2018-03-18 NOTE — Telephone Encounter (Signed)
MRI completed today but has not resulted.

## 2018-03-18 NOTE — Telephone Encounter (Signed)
Patient has requested for RN to call her to get a work-in appt for a follow-up to go over results.

## 2018-03-20 NOTE — Telephone Encounter (Signed)
  Telephone call to patient.  Made her aware that Dr. Lucia Gaskins and I had reviewed her MRI which could represent some mild inflammation but no evidence of hematoma or abscess.  See below MRI lumbar spine (without) demonstrating - Subtle nerve root enhancement in the cauda from L1-2 down to L3 levels within the right posterior spinal canal. 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  - No evidence of epidural hematoma or epidural abscess.  - Remainder of study is unremarkable.  Patient was ordered a 6-day prednisone Dosepak on her visit 1/8/2020which is complete.  She claims she cannot tell much difference in her symptoms.  She remains less mobile.  She is not able to work she wants to keep her appointment with Dr. Lucia Gaskins tomorrow.  Made her aware I would let Dr. Lucia Gaskins know.

## 2018-03-21 ENCOUNTER — Ambulatory Visit (INDEPENDENT_AMBULATORY_CARE_PROVIDER_SITE_OTHER): Payer: BLUE CROSS/BLUE SHIELD | Admitting: Neurology

## 2018-03-21 ENCOUNTER — Encounter: Payer: Self-pay | Admitting: Neurology

## 2018-03-21 DIAGNOSIS — G039 Meningitis, unspecified: Secondary | ICD-10-CM

## 2018-03-21 DIAGNOSIS — G932 Benign intracranial hypertension: Secondary | ICD-10-CM | POA: Diagnosis not present

## 2018-03-21 MED ORDER — GABAPENTIN 300 MG PO CAPS: 300.0000 mg | ORAL_CAPSULE | Freq: Three times a day (TID) | ORAL | 11 refills | Status: DC

## 2018-03-21 MED ORDER — OXYCODONE-ACETAMINOPHEN 10-325 MG PO TABS: 1.0000 | ORAL_TABLET | ORAL | 0 refills | Status: DC | PRN

## 2018-03-21 NOTE — Progress Notes (Signed)
[facility]    Provider:  Dr Lucia Gaskins Referring Provider: No ref. 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 She is very uncomfortable, sh ehas radicular pain and numbness sin the feet. Reviewed images with neuroradiologist Dr. Marjory Lies as well as Dr. Epimenio Foot in the office. Also called neuro-hospitalist Dr. Otelia Limes who agreed there is no intervention other than supportive. Will treat with pain management and neurontin.  Bottom of feet are numb, using a walker, severe pain in the lower back with radiation down the back annd the way to the feet on the bottom. Worsening. Getting weaker pe rpatient but suspect this is due to pain as opposed to weakness. No upper extremity symptoms. Brisk reflexes which would be very unlikely for AIDP-type picture.. stregth 4/5 throughout 3+ proximally.   HPI:  Laura Freeman is a 33 y.o. female here as requested by Dr. No ref. provider found for visual field defect.  Past medical history migraine, depression, anxiety.She has frequent headaches and migraines since having meningitis at the age of 40. She started having more frequent headaches and went to the eye doctor. Aunt has migraines. The headaches can feel like pressure. They can be pulsating/pounding and she can have nausea, vomiting, light and sound may bother her or may not. She has daily headaches. She has pressure every day. She has migraine days at least 8 a month. She vomits often. Movement makes it worse. No IUD, no vit A, no antibiotic long term use. She is obese. The headaches can be severe. Wakes up with headaches, continuous, unclear triggers.   Reviewed notes, labs and imaging from outside physicians, which showed:  Reviewed notes from my eye doctor in [place].  Patient's been seen multiple times in the last several months.  Patient last return for visual  fields, OCT NP [facility] secondary suspicion of glaucoma.  No complaints reported of physical ocular symptoms.  Not experiencing routine headaches or double vision.  No reports of visual floaters or light flashes.  In addition not experiencing blurry or uncomfortable vision.  Patient does have a history of migraines.  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  She was referred to neurology.  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  Review of Systems: Patient complains of symptoms per HPI as well as the following symptoms: Headache, depression, anxiety, loss of vision, eye pain, aching muscles, allergies, weight gain, fatigue. Pertinent negatives and positives per HPI. All others negative.   Social History   Socioeconomic History  . Marital status: Married    Spouse name: Not on file  . Number of children: 0  . Years of education: 53  . Highest education level: Associate degree: occupational, Scientist, product/process development, or vocational program  Occupational History  . Not on file  Social Needs  . Financial resource strain: Not on file  . Food insecurity:    Worry: Not on file    Inability: Not on file  . Transportation needs:    Medical: Not on file    Non-medical: Not on file  Tobacco Use  . Smoking status: Current Some Day Smoker    Types: Cigarettes  . Smokeless tobacco: Never Used  . Tobacco comment: social/stress smoke  Substance and Sexual Activity  . Alcohol use: Yes    Comment: might have a glass of wine here or there. Stopped regular drinking in her 25s.  . Drug use: Never  . Sexual activity: Not on file  Lifestyle  . Physical activity:    Days per week: Not on file    Minutes per session: Not on file  . Stress: Not on file    Relationships  . Social connections:    Talks on phone: Not on file    Gets together: Not on file    Attends religious service: Not on file    Active member of club or organization: Not on file    Attends meetings of clubs or organizations: Not on file    Relationship status: Not on file  . Intimate partner violence:    Fear of current or ex partner: Not on file    Emotionally abused: Not on file    Physically abused: Not on file    Forced sexual activity: Not on file  Other Topics Concern  . Not on file  Social History Narrative   Lives at home with her husband   Right handed   Caffeine: coffee 3-4 cups per day    Family History  Problem Relation Age of Onset  . Glaucoma Other        women on paternal side  . Migraines Paternal Aunt   . Pseudotumor cerebri Neg Hx     Past Medical History:  Diagnosis Date  . Anxiety   . Bacterial meningitis 2004  . Depression   . Migraine     Past Surgical History:  Procedure Laterality Date  . EPIDURAL BLOOD PATCH    . LUMBAR PUNCTURE    . PHARYNGECTOMY     x2  . WISDOM TOOTH EXTRACTION      Current Outpatient Medications  Medication Sig Dispense Refill  . acetaZOLAMIDE (DIAMOX) 250 MG tablet Take 1 tablet (250 mg total) by mouth 2 (two) times daily. 60 tablet 0  . gabapentin (NEURONTIN) 300 MG capsule Take 1 capsule (300 mg total) by mouth 3 (three) times daily. 90 capsule 11  . oxyCODONE-acetaminophen (PERCOCET) 10-325 MG tablet Take 1 tablet by mouth every 4 (four) hours as needed for pain. 30 tablet 0   No current facility-administered medications for this visit.     Allergies as of 03/21/2018 - Review Complete 03/21/2018  Allergen Reaction Noted  . Other  02/11/2018  . Valium [diazepam]  02/11/2018  . Penicillins Rash 02/12/2018    Vitals: BP 134/83 (BP Location: Left Arm, Patient Position: Sitting)   Pulse 88   Ht 5\' 4"  (1.626 m)   Wt 192 lb (87.1 kg)   BMI 32.96 kg/m  Last Weight:  Wt Readings from Last  1 Encounters:  03/21/18 192 lb (87.1 kg)   Last Height:   Ht Readings from Last 1 Encounters:  03/21/18 5\' 4"  (1.626 m)   Physical exam: Exam: Gen: NAD, conversant, well nourised, obese, well groomed                     CV: RRR, no MRG. No Carotid Bruits. No peripheral edema, warm, nontender Eyes: Conjunctivae clear without exudates or hemorrhage  Neuro: Detailed Neurologic Exam  Speech:    Speech is normal; fluent and spontaneous with normal comprehension.  Cognition:    The patient is oriented to person, place, and time;     recent and remote memory intact;  language fluent;     normal attention, concentration,     fund of knowledge Cranial Nerves:    The pupils are equal, round, and reactive to light. +papilledema +1 Visual fields are full to finger confrontation. Extraocular movements are intact. Trigeminal sensation is intact and the muscles of mastication are normal. The face is symmetric. The palate elevates in the midline. Hearing intact. Voice is normal. Shoulder shrug is normal. The tongue has normal motion without fasciculations.   Coordination:    Normal finger to nose and heel to shin. Normal rapid alternating movements.   Gait:    Using a walker, antalgic  Motor Observation:    No asymmetry, no atrophy, and no involuntary movements noted. Tone:    Normal muscle tone.    Posture:    Posture is normal. normal erect    Strength:    Strength is V/V in the upper limbs. Lower limbs: 3+ hip flexion, leg flexion 4/5     Sensation: intact to LT     Reflex Exam:  DTR's:    Deep tendon reflexes in the upper and lower extremities are brisk bilaterally.   Toes:    The toes are downgoing bilaterally.   Clonus:    Clonus is absent.     Assessment/Plan:  33 y/o with PMHx migraines, obesity, meningitis with papilledema. C/W IIH.  - Now with post-LP arachnoiditis and extremely uncomfortable with radiculopathy and pain. Will treat with pain management and  neurontin.  -.No upper extremity symptoms. 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   This can be an incidental finding but can also be seen with elevated intracranial pressures - Lumbar puncture opening pressure 33 - Obesity: losing weight is critical. Healthy weight and wellness center - Discussed IIH and risk of permanent vision loss, go to ED acutely if needed.   Discussed: To prevent or relieve headaches, try the following: Cool Compress. Lie down and place a cool compress on your head.  Avoid headache triggers. If certain foods or odors seem to have triggered your migraines in the past, avoid them. A headache diary might help you identify triggers.  Include physical activity in your daily routine. Try a daily walk or other moderate aerobic exercise.  Manage stress. Find healthy ways to cope with the stressors, such as delegating tasks on your to-do list.  Practice relaxation techniques. Try deep breathing, yoga, massage and visualization.  Eat regularly. Eating regularly scheduled meals and maintaining a healthy diet might help prevent headaches. Also, drink plenty of fluids.  Follow a regular sleep schedule. Sleep deprivation might contribute to headaches Consider biofeedback. With this mind-body technique, you learn to control certain bodily functions - such as muscle tension, heart rate and blood pressure - to prevent headaches or reduce headache pain.    Proceed to emergency room if you experience new or worsening symptoms or symptoms do not resolve, if you have new neurologic symptoms or if headache is severe, or for any concerning symptom.   Provided education and documentation from American headache Society toolbox  including articles on: chronic migraine medication overuse headache, chronic migraines, prevention of migraines, behavioral and other nonpharmacologic treatments for headache.  Cc: Dr. Frederico HammanYoakum   Tip Atienza, MD  [facility] [address] [place] [id number]  Phone [phone] Fax [phone]  A total of 45 minutes was spent face-to-face with this patient. Over half this time was spent on counseling patient on the  1. Arachnoiditis   2. IIH (idiopathic intracranial hypertension)     diagnosis and different diagnostic and therapeutic options, counseling and coordination of care, risks ans benefits of management, compliance, or risk factor reduction and education.

## 2018-03-21 NOTE — Patient Instructions (Signed)
Gabapentin capsules or tablets What is this medicine? GABAPENTIN (GA ba pen tin) is used to control seizures in certain types of epilepsy. It is also used to treat certain types of nerve pain. This medicine may be used for other purposes; ask your health care provider or pharmacist if you have questions. COMMON BRAND NAME(S): Active-PAC with Gabapentin, Gabarone, Neurontin What should I tell my health care provider before I take this medicine? They need to know if you have any of these conditions: -kidney disease -suicidal thoughts, plans, or attempt; a previous suicide attempt by you or a family member -an unusual or allergic reaction to gabapentin, other medicines, foods, dyes, or preservatives -pregnant or trying to get pregnant -breast-feeding How should I use this medicine? Take this medicine by mouth with a glass of water. Follow the directions on the prescription label. You can take it with or without food. If it upsets your stomach, take it with food. Take your medicine at regular intervals. Do not take it more often than directed. Do not stop taking except on your doctor's advice. If you are directed to break the 600 or 800 mg tablets in half as part of your dose, the extra half tablet should be used for the next dose. If you have not used the extra half tablet within 28 days, it should be thrown away. A special MedGuide will be given to you by the pharmacist with each prescription and refill. Be sure to read this information carefully each time. Talk to your pediatrician regarding the use of this medicine in children. While this drug may be prescribed for children as young as 3 years for selected conditions, precautions do apply. Overdosage: If you think you have taken too much of this medicine contact a poison control center or emergency room at once. NOTE: This medicine is only for you. Do not share this medicine with others. What if I miss a dose? If you miss a dose, take it as  soon as you can. If it is almost time for your next dose, take only that dose. Do not take double or extra doses. What may interact with this medicine? Do not take this medicine with any of the following medications: -other gabapentin products This medicine may also interact with the following medications: -alcohol -antacids -antihistamines for allergy, cough and cold -certain medicines for anxiety or sleep -certain medicines for depression or psychotic disturbances -homatropine; hydrocodone -naproxen -narcotic medicines (opiates) for pain -phenothiazines like chlorpromazine, mesoridazine, prochlorperazine, thioridazine This list may not describe all possible interactions. Give your health care provider a list of all the medicines, herbs, non-prescription drugs, or dietary supplements you use. Also tell them if you smoke, drink alcohol, or use illegal drugs. Some items may interact with your medicine. What should I watch for while using this medicine? Visit your doctor or health care professional for regular checks on your progress. You may want to keep a record at home of how you feel your condition is responding to treatment. You may want to share this information with your doctor or health care professional at each visit. You should contact your doctor or health care professional if your seizures get worse or if you have any new types of seizures. Do not stop taking this medicine or any of your seizure medicines unless instructed by your doctor or health care professional. Stopping your medicine suddenly can increase your seizures or their severity. Wear a medical identification bracelet or chain if you are taking this medicine for  seizures, and carry a card that lists all your medications. You may get drowsy, dizzy, or have blurred vision. Do not drive, use machinery, or do anything that needs mental alertness until you know how this medicine affects you. To reduce dizzy or fainting spells, do  not sit or stand up quickly, especially if you are an older patient. Alcohol can increase drowsiness and dizziness. Avoid alcoholic drinks. Your mouth may get dry. Chewing sugarless gum or sucking hard candy, and drinking plenty of water will help. The use of this medicine may increase the chance of suicidal thoughts or actions. Pay special attention to how you are responding while on this medicine. Any worsening of mood, or thoughts of suicide or dying should be reported to your health care professional right away. Women who become pregnant while using this medicine may enroll in the [place] Antiepileptic Drug Pregnancy Registry by calling [phone]. This registry collects information about the safety of antiepileptic drug use during pregnancy. What side effects may I notice from receiving this medicine? Side effects that you should report to your doctor or health care professional as soon as possible: -allergic reactions like skin rash, itching or hives, swelling of the face, lips, or tongue -worsening of mood, thoughts or actions of suicide or dying Side effects that usually do not require medical attention (report to your doctor or health care professional if they continue or are bothersome): -constipation -difficulty walking or controlling muscle movements -dizziness -nausea -slurred speech -tiredness -tremors -weight gain This list may not describe all possible side effects. Call your doctor for medical advice about side effects. You may report side effects to FDA at 1-800-FDA-1088. Where should I keep my medicine? Keep out of reach of children. This medicine may cause accidental overdose and death if it taken by other adults, children, or pets. Mix any unused medicine with a substance like cat litter or coffee grounds. Then throw the medicine away in a sealed container like a sealed bag or a coffee can with a lid. Do not use the medicine after the expiration date. Store at  room temperature between 15 and 30 degrees C (59 and 86 degrees F). NOTE: This sheet is a summary. It may not cover all possible information. If you have questions about this medicine, talk to your doctor, pharmacist, or health care provider.  2019 Elsevier/Gold Standard (2017-07-25 13:21:44)  Oxycodone tablets or capsules What is this medicine? OXYCODONE (ox i KOE done) is a pain reliever. It is used to treat moderate to severe pain. This medicine may be used for other purposes; ask your health care provider or pharmacist if you have questions. COMMON BRAND NAME(S): Dazidox, Endocodone, Oxaydo, OXECTA, OxyIR, Percolone, Roxicodone, Roxybond What should I tell my health care provider before I take this medicine? They need to know if you have any of these conditions: -Addison's disease -brain tumor -head injury -heart disease -history of drug or alcohol abuse problem -if you often drink alcohol -kidney disease -liver disease -lung or breathing disease, like asthma -mental illness -pancreatic disease -seizures -thyroid disease -an unusual or allergic reaction to oxycodone, codeine, hydrocodone, morphine, other medicines, foods, dyes, or preservatives -pregnant or trying to get pregnant -breast-feeding How should I use this medicine? Take this medicine by mouth with a glass of water. Follow the directions on the prescription label. You can take it with or without food. If it upsets your stomach, take it with food. Take your medicine at regular intervals. Do not take it more often  than directed. Do not stop taking except on your doctor's advice. Some brands of this medicine, like Oxecta, have special instructions. Ask your doctor or pharmacist if these directions are for you: Do not cut, crush or chew this medicine. Swallow only one tablet at a time. Do not wet, soak, or lick the tablet before you take it. A special MedGuide will be given to you by the pharmacist with each prescription and  refill. Be sure to read this information carefully each time. Talk to your pediatrician regarding the use of this medicine in children. Special care may be needed. Overdosage: If you think you have taken too much of this medicine contact a poison control center or emergency room at once. NOTE: This medicine is only for you. Do not share this medicine with others. What if I miss a dose? If you miss a dose, take it as soon as you can. If it is almost time for your next dose, take only that dose. Do not take double or extra doses. What may interact with this medicine? This medicine may interact with the following medications: -alcohol -antihistamines for allergy, cough and cold -antiviral medicines for HIV or AIDS -atropine -certain antibiotics like clarithromycin, erythromycin, linezolid, rifampin -certain medicines for anxiety or sleep -certain medicines for bladder problems like oxybutynin, tolterodine -certain medicines for depression like amitriptyline, fluoxetine, sertraline -certain medicines for fungal infections like ketoconazole, itraconazole, voriconazole -certain medicines for migraine headache like almotriptan, eletriptan, frovatriptan, naratriptan, rizatriptan, sumatriptan, zolmitriptan -certain medicines for nausea or vomiting like dolasetron, ondansetron, palonosetron -certain medicines for Parkinson's disease like benztropine, trihexyphenidyl -certain medicines for seizures like phenobarbital, phenytoin, primidone -certain medicines for stomach problems like dicyclomine, hyoscyamine -certain medicines for travel sickness like scopolamine -diuretics -general anesthetics like halothane, isoflurane, methoxyflurane, propofol -ipratropium -local anesthetics like lidocaine, pramoxine, tetracaine -MAOIs like Carbex, Eldepryl, Marplan, Nardil, and Parnate -medicines that relax muscles for surgery -methylene blue -nilotinib -other narcotic medicines for pain or  cough -phenothiazines like chlorpromazine, mesoridazine, prochlorperazine, thioridazine This list may not describe all possible interactions. Give your health care provider a list of all the medicines, herbs, non-prescription drugs, or dietary supplements you use. Also tell them if you smoke, drink alcohol, or use illegal drugs. Some items may interact with your medicine. What should I watch for while using this medicine? Tell your doctor or health care professional if your pain does not go away, if it gets worse, or if you have new or a different type of pain. You may develop tolerance to the medicine. Tolerance means that you will need a higher dose of the medicine for pain relief. Tolerance is normal and is expected if you take this medicine for a long time. Do not suddenly stop taking your medicine because you may develop a severe reaction. Your body becomes used to the medicine. This does NOT mean you are addicted. Addiction is a behavior related to getting and using a drug for a non-medical reason. If you have pain, you have a medical reason to take pain medicine. Your doctor will tell you how much medicine to take. If your doctor wants you to stop the medicine, the dose will be slowly lowered over time to avoid any side effects. There are different types of narcotic medicines (opiates). If you take more than one type at the same time or if you are taking another medicine that also causes drowsiness, you may have more side effects. Give your health care provider a list of all medicines you use. Your  doctor will tell you how much medicine to take. Do not take more medicine than directed. Call emergency for help if you have problems breathing or unusual sleepiness. You may get drowsy or dizzy. Do not drive, use machinery, or do anything that needs mental alertness until you know how the medicine affects you. Do not stand or sit up quickly, especially if you are an older patient. This reduces the risk of  dizzy or fainting spells. Alcohol may interfere with the effect of this medicine. Avoid alcoholic drinks. This medicine will cause constipation. Try to have a bowel movement at least every 2 to 3 days. If you do not have a bowel movement for 3 days, call your doctor or health care professional. Your mouth may get dry. Chewing sugarless gum or sucking hard candy, and drinking plenty of water may help. Contact your doctor if the problem does not go away or is severe. What side effects may I notice from receiving this medicine? Side effects that you should report to your doctor or health care professional as soon as possible: -allergic reactions like skin rash, itching or hives, swelling of the face, lips, or tongue -breathing problems -confusion -signs and symptoms of low blood pressure like dizziness; feeling faint or lightheaded, falls; unusually weak or tired -trouble passing urine or change in the amount of urine -trouble swallowing Side effects that usually do not require medical attention (report to your doctor or health care professional if they continue or are bothersome): -constipation -dry mouth -nausea, vomiting -tiredness This list may not describe all possible side effects. Call your doctor for medical advice about side effects. You may report side effects to FDA at 1-800-FDA-1088. Where should I keep my medicine? Keep out of the reach of children. This medicine can be abused. Keep your medicine in a safe place to protect it from theft. Do not share this medicine with anyone. Selling or giving away this medicine is dangerous and against the law. Store at room temperature between 15 and 30 degrees C (59 and 86 degrees F). Protect from light. Keep container tightly closed. This medicine may cause harm and death if it is taken by other adults, children, or pets. Return medicine that has not been used to an official disposal site. Contact the DEA at (704)267-98661-587-550-8966 or your city/county  government to find a site. If you cannot return the medicine, flush it down the toilet. Do not use the medicine after the expiration date. NOTE: This sheet is a summary. It may not cover all possible information. If you have questions about this medicine, talk to your doctor, pharmacist, or health care provider.  2019 Elsevier/Gold Standard (2016-06-26 16:13:10)

## 2018-03-24 DIAGNOSIS — G039 Meningitis, unspecified: Secondary | ICD-10-CM | POA: Insufficient documentation

## 2018-03-24 DIAGNOSIS — G932 Benign intracranial hypertension: Secondary | ICD-10-CM | POA: Insufficient documentation

## 2018-03-25 ENCOUNTER — Telehealth: Payer: Self-pay | Admitting: Neurology

## 2018-03-25 ENCOUNTER — Other Ambulatory Visit: Payer: Self-pay | Admitting: Neurology

## 2018-03-25 ENCOUNTER — Other Ambulatory Visit: Payer: Self-pay

## 2018-03-25 ENCOUNTER — Encounter (HOSPITAL_COMMUNITY): Payer: Self-pay | Admitting: Emergency Medicine

## 2018-03-25 ENCOUNTER — Emergency Department (HOSPITAL_COMMUNITY): Payer: BLUE CROSS/BLUE SHIELD

## 2018-03-25 ENCOUNTER — Emergency Department (HOSPITAL_COMMUNITY)
Admission: EM | Admit: 2018-03-25 | Discharge: 2018-03-25 | Disposition: A | Discharge status: Home / Self Care | Payer: BLUE CROSS/BLUE SHIELD | Attending: Emergency Medicine | Admitting: Emergency Medicine

## 2018-03-25 DIAGNOSIS — F17210 Nicotine dependence, cigarettes, uncomplicated: Secondary | ICD-10-CM | POA: Insufficient documentation

## 2018-03-25 DIAGNOSIS — M549 Dorsalgia, unspecified: Secondary | ICD-10-CM | POA: Diagnosis not present

## 2018-03-25 DIAGNOSIS — M545 Low back pain, unspecified: Secondary | ICD-10-CM

## 2018-03-25 LAB — BASIC METABOLIC PANEL
Anion gap: 9 (ref 5–15)
BUN: 9 mg/dL (ref 6–20)
CO2: 20 mmol/L — ABNORMAL LOW (ref 22–32)
Calcium: 8.8 mg/dL — ABNORMAL LOW (ref 8.9–10.3)
Chloride: 107 mmol/L (ref 98–111)
Creatinine, Ser: 0.7 mg/dL (ref 0.44–1.00)
GFR calc Af Amer: >60 mL/min (ref >60)
GFR calc non Af Amer: >60 mL/min (ref >60)
GLUCOSE: 123 mg/dL — AB (ref 70–99)
Potassium: 3.4 mmol/L — ABNORMAL LOW (ref 3.5–5.1)
Sodium: 136 mmol/L (ref 135–145)

## 2018-03-25 MED ORDER — GADOBUTROL 1 MMOL/ML IV SOLN
8.0000 mL | Freq: Once | INTRAVENOUS | Status: AC | PRN
  Administered 2018-03-25: 8 mL via INTRAVENOUS

## 2018-03-25 NOTE — ED Triage Notes (Signed)
Pt sent by neurology for a follow up MRI due to continuous lower back pain s/p LP  Provider called ahead to let us known they wanted to r/o nerve root encroachment.

## 2018-03-25 NOTE — ED Provider Notes (Signed)
[facility]   CSN: [id number] Arrival date & time: 03/25/18  1500     History   Chief Complaint Chief Complaint  Patient presents with  . Sent by Provider for MRI    HPI Laura Freeman is a 33 y.o. female.  Patient complains of persistent back pain.  She has had injections in her back but her neurologist sent her over here for an MRI.  The patient has pain running down both legs but worse on the left  The history is provided by the patient. No language interpreter was used.  Back Pain  Location:  Lumbar spine Quality:  Aching Radiates to:  L thigh Pain severity:  Moderate Pain is:  Worse during the day Onset quality:  Gradual Timing:  Constant Progression:  Waxing and waning Chronicity:  Recurrent Context: not emotional stress   Relieved by:  Nothing Exacerbated by: Movement. Ineffective treatments:  Bed rest Associated symptoms: no abdominal pain, no chest pain and no headaches   Risk factors: no hx of cancer     Past Medical History:  Diagnosis Date  . Anxiety   . Bacterial meningitis 2004  . Depression   . Migraine     Patient Active Problem List   Diagnosis Date Noted  . 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 Acute back pain 03/12/2018  . Gait abnormality 03/12/2018  . 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 Headache 02/13/2018    Past Surgical History:  Procedure Laterality Date  . EPIDURAL BLOOD PATCH    . LUMBAR PUNCTURE    . PHARYNGECTOMY     x2  . WISDOM TOOTH EXTRACTION       OB History   No obstetric history on file.      Home Medications    Prior to Admission medications   Medication Sig Start Date End Date Taking? Authorizing Provider  acetaZOLAMIDE (DIAMOX) 250 MG tablet Take 1 tablet (250 mg total) by mouth 2 (two) times daily. 03/11/18   Penumalli, Glenford BayleyVikram R, MD  gabapentin (NEURONTIN) 300 MG capsule Take 1 capsule (300 mg total) by mouth 3  (three) times daily. 03/21/18   Anson FretAhern, Antonia B, MD  oxyCODONE-acetaminophen (PERCOCET) 10-325 MG tablet Take 1 tablet by mouth every 4 (four) hours as needed for pain. 03/21/18   Anson FretAhern, Antonia B, MD    Family History Family History  Problem Relation Age of Onset  . Glaucoma Other        women on paternal side  . Migraines Paternal Aunt   . Pseudotumor cerebri Neg Hx     Social History Social History   Tobacco Use  . Smoking status: Current Some Day Smoker    Types: Cigarettes  . Smokeless tobacco: Never Used  . Tobacco comment: social/stress smoke  Substance Use Topics  . Alcohol use: Yes    Comment: might have a glass of wine here or there. Stopped regular drinking in her 2620s.  . Drug use: Never     Allergies   Other; Valium [diazepam]; and Penicillins   Review of Systems Review of Systems  Constitutional: Negative for appetite change and fatigue.  HENT: Negative for congestion, ear discharge and sinus pressure.   Eyes: Negative for discharge.  Respiratory: Negative for cough.   Cardiovascular: Negative for chest pain.  Gastrointestinal: Negative for abdominal pain and diarrhea.  Genitourinary: Negative for frequency and hematuria.  Musculoskeletal: Positive for back pain.  Skin: Negative for rash.  Neurological: Negative  for seizures and headaches.  Psychiatric/Behavioral: Negative for hallucinations.  All other systems reviewed and are negative.    Physical Exam Updated Vital Signs BP 135/77   Pulse 64   Temp 97.7 F (36.5 C) (Oral)   Resp 16   Ht 5\' 4"  (1.626 m)   Wt 85.7 kg   SpO2 99%   BMI 32.44 kg/m   Physical Exam Vitals signs and nursing note reviewed.  Constitutional:      Appearance: She is well-developed.  HENT:     Head: Normocephalic.     Nose: Nose normal.  Eyes:     General: No scleral icterus.    Conjunctiva/sclera: Conjunctivae normal.  Neck:     Musculoskeletal: Neck supple.     Thyroid: No thyromegaly.  Cardiovascular:      Rate and Rhythm: Normal rate and regular rhythm.     Heart sounds: No murmur. No friction rub. No gallop.   Pulmonary:     Breath sounds: No stridor. No wheezing or rales.  Chest:     Chest wall: No tenderness.  Abdominal:     General: There is no distension.     Tenderness: There is no abdominal tenderness. There is no rebound.  Musculoskeletal: Normal range of motion.  Lymphadenopathy:     Cervical: No cervical adenopathy.  Skin:    Findings: No erythema or rash.  Neurological:     Mental Status: She is oriented to person, place, and time.     Motor: No abnormal muscle tone.     Coordination: Coordination normal.  Psychiatric:        Behavior: Behavior normal.      ED Treatments / Results  Labs (all labs ordered are listed, but only abnormal results are displayed) Labs Reviewed  BASIC METABOLIC PANEL - Abnormal; Notable for the following components:      Result Value   Potassium 3.4 (*)    CO2 20 (*)    Glucose, Bld 123 (*)    Calcium 8.8 (*)    All other components within normal limits    EKG None  Radiology Mr Lumbar Spine W Wo Contrast  Result Date: 03/25/2018 CLINICAL DATA:  33 y/o F; progressive lower back pain with bilateral lower extremity weakness. Recent lumbar puncture and blood patch. EXAM: MRI LUMBAR SPINE WITHOUT AND WITH CONTRAST TECHNIQUE: Multiplanar and multiecho pulse sequences of the lumbar spine were obtained without and with intravenous contrast. CONTRAST:  8 cc Gadavist COMPARISON:  03/18/2018 lumbar spine MRI. FINDINGS: Segmentation:  Standard. Alignment:  Physiologic. Vertebrae:  No fracture, evidence of discitis, or bone lesion. Conus medullaris and cauda equina: Conus extends to the L1 level. Two stable linear enhancing structures are present in the posterior and right posterior thecal sac along the course of the cauda equina nerve roots (series 11, image 9 and series 11, image 25). No thickening of the cauda equina nerve roots, pumping,  architectural distortion. No epidural collection or thecal enhancement. Paraspinal and other soft tissues: Negative. Disc levels: L1-2: No significant disc displacement, foraminal stenosis, or canal stenosis. L2-3: No significant disc displacement, foraminal stenosis, or canal stenosis. L3-4: No significant disc displacement, foraminal stenosis, or canal stenosis. L4-5: Mild disc bulge.  No significant foraminal or canal stenosis. L5-S1: No significant disc displacement, foraminal stenosis, or canal stenosis. IMPRESSION: 1. Two stable linear enhancing structures are present in the posterior and right posterior thecal sac along the course of the cauda equina nerve roots. No secondary finding of inflammation such as  cauda equina nerve thickening or clumping. 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 Minimal radiculitis or arachnoiditis given recent intervention is considered less likely. 2. Mild discogenic degenerative changes at the L4-5 level. No foraminal stenosis, canal stenosis, or nerve impingement identified. 3. No acute osseous abnormality. Electronically Signed   By: Mitzi HansenLance  Furusawa-Stratton M.D.   On: 03/25/2018 20:05    Procedures Procedures (including critical care time)  Medications Ordered in ED Medications  gadobutrol (GADAVIST) 1 MMOL/ML injection 8 mL (8 mLs Intravenous Contrast Given 03/25/18 1918)     Initial Impression / Assessment and Plan / ED Course  I have reviewed the triage vital signs and the nursing notes.  Pertinent labs & imaging results that were available during my care of the patient were reviewed by me and considered in my medical decision making (see chart for details).   Positive straight leg raise on the left.  MRI does not show any significant nerve impingement.  She is to follow-up with her neurologist    Final Clinical Impressions(s) / ED Diagnoses   Final diagnoses:  Back pain at L4-L5 level    ED  Discharge Orders    None       Bethann BerkshireZammit, Paulette Rockford, MD 03/25/18 2104

## 2018-03-25 NOTE — Telephone Encounter (Signed)
Noted  

## 2018-03-25 NOTE — Progress Notes (Signed)
error 

## 2018-03-25 NOTE — Telephone Encounter (Signed)
I spoke with patient today she does not feel as though she is improved.  She is in more pain, she describing more weakness and more sensory changes in her legs.  Patient had a lumbar puncture for intracranial idiopathic hypertension and reported significant progressive lower extremity weakness, pain, sensory changes.  MRI of the lumbar spine 1 week ago showed nerve root enhancement and she was treated symptomatically for possible post procedure inflammation or arachnoiditis.  However given her worsening symptoms and today inability to defecate (which may be due to the opioid medication unclear but need to check for cauda equina or other etiologies, progressive nerve root enhancement) I recommend that she goes to the emergency room for a repeat MRI of the lumbar spine.

## 2018-03-25 NOTE — Discharge Instructions (Addendum)
Follow-up with your neurologist within a week

## 2018-03-28 ENCOUNTER — Other Ambulatory Visit: Payer: Self-pay | Admitting: Neurology

## 2018-03-28 ENCOUNTER — Telehealth: Payer: Self-pay | Admitting: Neurology

## 2018-03-28 DIAGNOSIS — M5441 Lumbago with sciatica, right side: Secondary | ICD-10-CM

## 2018-03-28 DIAGNOSIS — M5442 Lumbago with sciatica, left side: Secondary | ICD-10-CM

## 2018-03-28 MED ORDER — PREGABALIN 300 MG PO CAPS: 300.0000 mg | ORAL_CAPSULE | Freq: Two times a day (BID) | ORAL | 4 refills | Status: DC

## 2018-03-28 NOTE — Telephone Encounter (Signed)
Called the patient and there was no answer. LVM informing the patient that Dr Lucia Gaskins called the neuro radiologist on her behalf and the imaging indication there was improvement no signs of infection or inflammation at this point. Advised the patient to stop taking gabapentin and start taking lyrica and that the lyrica was called into the [facility] in [place]. Informed the patient to call the office Monday with any other questions she may have as the office is closing at 12.

## 2018-03-28 NOTE — Telephone Encounter (Signed)
I spoke with neuroradiologist at [facility] imaging, the last MRI of the lumbar spine appeared improved and did not show any significant signs of infection or inflammation at this point its medical management and PT. Stop Gabapentin, start Lyrica.

## 2018-03-28 NOTE — Telephone Encounter (Signed)
Patient called, still having significant pain after LP. 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 Will call [facility] imaging as well.  She is on Neurontin 1200mg  tid will stop and change to Lyrica. She also has oxycodone which is a lot of pain medication and its unusual she is not better exam last time was unremarkable.

## 2018-03-31 ENCOUNTER — Telehealth: Payer: Self-pay | Admitting: Neurology

## 2018-03-31 NOTE — Telephone Encounter (Addendum)
Pt is needing advise on some medications and PT and about a chair for work. Pt is requesting a call back from the provider, she states its several things that she is needing to discuss. Please advise.

## 2018-04-01 NOTE — Telephone Encounter (Signed)
I spoke with the patient. She has 4 questions/requests for Dr. Lucia Gaskins: 1. Can she receive a referral to PT? [facility] [phone][facility] in [place] 2. Can she get a doctor's note for a chair for work that will not cause as much pain or should she see PT first and see what they recommend? 3. She needs a refill of Acetazolamide. She currently takes 250 mg BID.  4. When she returns to work, she would like a note indicating her restrictions (including not to lift heavy objects or bend over until she improves). She stated this was d/w Dr. Lucia Gaskins in her office visit.

## 2018-04-02 ENCOUNTER — Other Ambulatory Visit: Payer: Self-pay | Admitting: Neurology

## 2018-04-02 DIAGNOSIS — M5442 Lumbago with sciatica, left side: Secondary | ICD-10-CM

## 2018-04-02 DIAGNOSIS — M5441 Lumbago with sciatica, right side: Secondary | ICD-10-CM

## 2018-04-02 MED ORDER — ACETAZOLAMIDE ER 500 MG PO CP12: 500.0000 mg | ORAL_CAPSULE | Freq: Two times a day (BID) | ORAL | 6 refills | Status: DC

## 2018-04-02 NOTE — Telephone Encounter (Signed)
I called the patient and discussed Dr. Trevor Mace message with her. Pt aware that her diamox is now 500 mg twice daily and a new prescription has been sent to her pharmacy. I advised pt to call us when she is going back to work and we can write the letter. Also see what PT recommends as far as the chair for work. Pt verbalized appreciation and her questions were answered.

## 2018-04-02 NOTE — Telephone Encounter (Signed)
I placed referral for PT. I called in a new script for acetazolamide and have increased the dose. I would see what PT recommends for a chair I really don;t know what is best. When she returns to work she can let us know and we will write a note. thanks

## 2018-07-21 ENCOUNTER — Telehealth: Payer: Self-pay | Admitting: *Deleted

## 2018-07-21 ENCOUNTER — Telehealth: Payer: Self-pay | Admitting: Neurology

## 2018-07-21 ENCOUNTER — Other Ambulatory Visit: Payer: Self-pay

## 2018-07-21 ENCOUNTER — Encounter: Payer: Self-pay | Admitting: *Deleted

## 2018-07-21 ENCOUNTER — Ambulatory Visit: Payer: Self-pay | Admitting: Neurology

## 2018-07-21 DIAGNOSIS — G932 Benign intracranial hypertension: Secondary | ICD-10-CM

## 2018-07-21 NOTE — Telephone Encounter (Signed)
Pt called in and stated her call dropped during her appt , she called back but had to wait to get through due to que being full, she is requesting a call back to finish her appt.  CB# [phone]

## 2018-07-21 NOTE — Telephone Encounter (Signed)
Would you let patient know I spent 20 minutes on hold to Dr. Grayland Jack office trying to get a hold of someone. I had to hang up because I had another appointment.  Patient says she is having vision changes but can;t reach her eye doctor, Dr. Abel Presto. I can offer patient a referral to an ophthalmologist bc I can;t reach Dr. Abel Presto either. Their message says she can schedule an appointment on their website and that they are seeing patients in the office now, she could try that or I am happy to try and find her another doctor to see but it may be here in [place]. Thanks

## 2018-07-21 NOTE — Telephone Encounter (Signed)
I spoke with the patient. She had called earlier this morning and spoke with Irving Burton. Per Irving Burton, pt stated she had returned our calls last week and spoke with phone staff providing consent for doxy visit and received link. Pt was waiting online today for visit. Unfortunately this was not documented and visit was not able to be completed this morning at that time. Pt was rescheduled for a telephone visit with Dr. Lucia Gaskins (no charge) @ noon today. I spoke with the patient just now, apologized for the error. I confirmed pt using 2 identifiers and updated her chart. No changes to PMH. Meds updated. Pt taking the diamox 500 mg BID. Pt consented to the telephone visit. She verbalized appreciation for the call.

## 2018-07-21 NOTE — Progress Notes (Signed)
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  Location: Patient: home Provider: office   I discussed the limitations of evaluation and management by telemedicine and the availability of in person appointments. The patient expressed understanding and agreed to proceed.   Follow Up Instructions:    I discussed the assessment and treatment plan with the patient. The patient was provided an opportunity to ask questions and all were answered. The patient agreed with the plan and demonstrated an understanding of the instructions.   The patient was advised to call back or seek an in-person evaluation if the symptoms worsen or if the condition fails to improve as anticipated.  I provided 40 minutes of non-face-to-face time during this encounter.   Anson Fret, MD   Interval history 07/21/2018: She is walking now, she is going through physical therapy, she is walking again with a cane. She started working from home in March. Then she became laid off. Headaches worsened. She can feel the fluid on her ears. After the LP she did not feel fluid anymore, improved in one way but now the fluid is there again. She takes  twice daily. She is on unemployment on June 2nd. Increase acetazolamide to  three times a day. Encuraged her to see Dr. Abel Presto in [place]. She feels her vision has changed. She needs to see Dr. Abel Presto, asked her to call his office.  [phone], waited on the phone for 20 minutes without answer. Can refer to ophthalmologist elsewhere or patient can come to office.  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 She is very uncomfortable, sh ehas radicular pain and numbness sin the feet. Reviewed images with neuroradiologist Dr. Marjory Lies as well as Dr. Epimenio Foot in the office. Also called neuro-hospitalist Dr. Otelia Limes who agreed there is no intervention other than supportive. Will treat with pain management and neurontin.  Bottom of feet are numb, using a walker, severe pain in the lower back with radiation down the back annd the way to the feet on the bottom. Worsening. Getting weaker pe rpatient but suspect this is due to pain as opposed to weakness. No upper extremity symptoms. Brisk reflexes which would be very unlikely for AIDP-type picture.. stregth 4/5 throughout 3+ proximally.   HPI:  Laura Freeman is a 33 y.o. female here as requested by Dr. No ref. provider found for visual field defect.  Past medical history migraine, depression, anxiety.She has frequent headaches and migraines since having meningitis at the age of 43. She started having more frequent headaches and went to the eye doctor. Aunt has migraines. The headaches can feel like pressure. They can be pulsating/pounding and she can have nausea, vomiting, light and sound may bother her or may not. She has daily headaches. She has pressure every day. She has migraine days at least 8 a month. She vomits often. Movement makes it worse. No IUD, no vit A, no antibiotic long term use. She is obese. The headaches can be severe. Wakes up with headaches, continuous, unclear triggers.   Reviewed notes, labs and imaging from outside physicians, which showed:  Reviewed notes from my  eye doctor in [place].  Patient's been seen multiple times in the last several months.  Patient last return for visual fields, OCT NP [facility] secondary suspicion of glaucoma.  No complaints reported of physical ocular symptoms.  Not experiencing routine headaches or double vision.  No reports of visual floaters or light flashes.   In addition not experiencing blurry or uncomfortable vision.  Patient does have a history of migraines.  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  She was referred to neurology.  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  Review of Systems: Patient complains of symptoms per HPI as well as the following symptoms: Headache, depression, anxiety, loss of vision, eye pain, aching muscles, allergies, weight gain, fatigue. Pertinent negatives and positives per HPI. All others negative.   Social History   Socioeconomic History  . Marital status: Married    Spouse name: Not on file  . Number of children: 0  . Years of education: 34  . Highest education level: Associate degree: occupational, Scientist, product/process development, or vocational program  Occupational History  . Not on file  Social Needs  . Financial resource strain: Not on file  . Food insecurity:    Worry: Not on file    Inability: Not on file  . Transportation needs:    Medical: Not on file    Non-medical: Not on file  Tobacco Use  . Smoking status: Current Some Day Smoker    Types: Cigarettes  . Smokeless tobacco: Never Used  . Tobacco comment: social/stress smoke  Substance and Sexual Activity  . Alcohol use: Yes    Comment: might have a glass of wine here or there. Stopped regular drinking in her 83s.  . Drug use: Never  . Sexual activity: Not on file  Lifestyle  . Physical activity:    Days per week: Not on file    Minutes per session: Not on file  . Stress: Not on file  Relationships  . Social connections:    Talks on phone: Not on file    Gets together: Not on file    Attends religious service: Not on file    Active member of club or organization: Not on file    Attends  meetings of clubs or organizations: Not on file    Relationship status: Not on file  . Intimate partner violence:    Fear of current or ex partner: Not on file    Emotionally abused: Not on file    Physically abused: Not on file    Forced sexual activity: Not on file  Other Topics Concern  . Not on file  Social History Narrative   Lives at home with her husband   Right handed   Caffeine: coffee 3-4 cups per day    Family History  Problem Relation Age of Onset  . Glaucoma Other        women on paternal side  . Migraines Paternal Aunt   . Pseudotumor cerebri Neg Hx     Past Medical History:  Diagnosis Date  . Anxiety   . Bacterial meningitis 2004  . Depression   . Migraine     Past Surgical History:  Procedure Laterality Date  . EPIDURAL BLOOD PATCH    . LUMBAR PUNCTURE    . PHARYNGECTOMY  x2  . WISDOM TOOTH EXTRACTION      Current Outpatient Medications  Medication Sig Dispense Refill  . acetaZOLAMIDE (DIAMOX SEQUELS) 500 MG capsule Take 1 capsule (500 mg total) by mouth 3 (three) times daily. 60 capsule 6  . pregabalin (LYRICA) 150 MG capsule Take 1-2 (150mg -300mg ) caps at bedtime 60 capsule 4   No current facility-administered medications for this visit.     Allergies as of 07/21/2018 - Review Complete 07/21/2018  Allergen Reaction Noted  . Other  02/11/2018  . Valium [diazepam]  02/11/2018  . Penicillins Rash 02/12/2018    Vitals: There were no vitals taken for this visit. Last Weight:  Wt Readings from Last 1 Encounters:  07/21/18 209 lb (94.8 kg)   Last Height:   Ht Readings from Last 1 Encounters:  07/21/18 5' 3.75" (1.619 m)   Prior exam:   Physical exam: Exam: Gen: NAD, conversant, well nourised, obese, well groomed                     CV: RRR, no MRG. No Carotid Bruits. No peripheral edema, warm, nontender Eyes: Conjunctivae clear without exudates or hemorrhage  Neuro: Detailed Neurologic Exam  Speech:    Speech is normal; fluent  and spontaneous with normal comprehension.  Cognition:    The patient is oriented to person, place, and time;     recent and remote memory intact;     language fluent;     normal attention, concentration,     fund of knowledge Cranial Nerves:    The pupils are equal, round, and reactive to light. +papilledema +1 Visual fields are full to finger confrontation. Extraocular movements are intact. Trigeminal sensation is intact and the muscles of mastication are normal. The face is symmetric. The palate elevates in the midline. Hearing intact. Voice is normal. Shoulder shrug is normal. The tongue has normal motion without fasciculations.   Coordination:    Normal finger to nose and heel to shin. Normal rapid alternating movements.   Gait:    Using a walker, antalgic  Motor Observation:    No asymmetry, no atrophy, and no involuntary movements noted. Tone:    Normal muscle tone.    Posture:    Posture is normal. normal erect    Strength:    Strength is V/V in the upper limbs. Lower limbs: 3+ hip flexion, leg flexion 4/5     Sensation: intact to LT     Reflex Exam:  DTR's:    Deep tendon reflexes in the upper and lower extremities are brisk bilaterally.   Toes:    The toes are downgoing bilaterally.   Clonus:    Clonus is absent.     Assessment/Plan:  33 y/o with PMHx migraines, obesity, meningitis with papilledema. C/W IIH.  - Improved post-LP arachnoiditis. No upper extremity symptoms. Brisk reflexes which would be very unlikely for AIDP-type picture. Improved, decrease lyria to 300mg  qhs and then 150mg  qhs as needed. 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   This can be an incidental finding but can also be seen with elevated intracranial pressures -  Lumbar puncture opening pressure 33 - Obesity: losing weight is critical. Healthy weight and wellness center - Discussed IIH and risk of permanent vision  loss, go to ED acutely if needed.  - Can;t reach Dr. Ellamae Siayoakum's ofice, have offered her referral to an ophthalmologist in town Dr. Dione BoozeGroat or Dr. Sherryll BurgerShah.  Meds ordered this encounter  Medications  . acetaZOLAMIDE (DIAMOX SEQUELS) 500 MG capsule    Sig: Take 1 capsule (500 mg total) by mouth 3 (three) times daily.    Dispense:  60 capsule    Refill:  6  . pregabalin (LYRICA) 150 MG capsule    Sig: Take 1-2 (150mg -300mg ) caps at bedtime    Dispense:  60 capsule    Refill:  4    Discontinue gabapentin     Discussed: To prevent or relieve headaches, try the following: Cool Compress. Lie down and place a cool compress on your head.  Avoid headache triggers. If certain foods or odors seem to have triggered your migraines in the past, avoid them. A headache diary might help you identify triggers.  Include physical activity in your daily routine. Try a daily walk or other moderate aerobic exercise.  Manage stress. Find healthy ways to cope with the stressors, such as delegating tasks on your to-do list.  Practice relaxation techniques. Try deep breathing, yoga, massage and visualization.  Eat regularly. Eating regularly scheduled meals and maintaining a healthy diet might help prevent headaches. Also, drink plenty of fluids.  Follow a regular sleep schedule. Sleep deprivation might contribute to headaches Consider biofeedback. With this mind-body technique, you learn to control certain bodily functions - such as muscle tension, heart rate and blood pressure - to prevent headaches or reduce headache pain.    Proceed to emergency room if you experience new or worsening symptoms or symptoms do not resolve, if you have new neurologic symptoms or if headache is severe, or for any concerning symptom.   Provided education and documentation from American  headache Society toolbox including articles on: chronic migraine medication overuse headache, chronic migraines, prevention of migraines, behavioral and other nonpharmacologic treatments for headache.  Cc: Dr. Frederico HammanYoakum   Ibraham Levi, MD  [facility] [address] [place], [place] [id number]  Phone [phone] Fax [phone]

## 2018-07-22 ENCOUNTER — Other Ambulatory Visit: Payer: Self-pay | Admitting: *Deleted

## 2018-07-22 DIAGNOSIS — G932 Benign intracranial hypertension: Secondary | ICD-10-CM

## 2018-07-22 MED ORDER — PREGABALIN 150 MG PO CAPS: ORAL_CAPSULE | ORAL | 4 refills | Status: DC

## 2018-07-22 MED ORDER — ACETAZOLAMIDE ER 500 MG PO CP12: 500.0000 mg | ORAL_CAPSULE | Freq: Three times a day (TID) | ORAL | 6 refills | Status: DC

## 2018-07-22 NOTE — Telephone Encounter (Signed)
I spoke with Dr. Lucia Gaskins. Will refer to ophthalmology and request location [facility] or [place]. Dr. Lucia Gaskins will no charge the visit from yesterday.

## 2018-07-22 NOTE — Addendum Note (Signed)
Addended by: Naomie Dean B on: 07/22/2018 03:23 PM   Modules accepted: Level of Service

## 2018-07-22 NOTE — Telephone Encounter (Signed)
Pt returned call and stated the she refuses to leave messages and she is demanding a call back. Please advise.

## 2018-07-22 NOTE — Telephone Encounter (Signed)
I called the pt @ [phone] and LVM (ok per DPR) with detailed message from Dr. Lucia Gaskins below. Discussed options of trying to schedule with Dr. Abel Presto online or Dr. Lucia Gaskins can refer to an ophthamologist but it may be here in [place]. Left office number in message for call back.

## 2018-07-22 NOTE — Telephone Encounter (Signed)
I called the patient back and her vm answered immediately. I LVM asking for call back. Left office number in message.

## 2018-07-22 NOTE — Telephone Encounter (Signed)
Pt returned my call. She is willing to have a referral sent to another ophthamologist however she would like for it to be closer to her if possible. She suggested [facility] or [place]. She also stated she was told after the visit that it would be filed with insurance and was under the impression it would be a no charge. I told her I would look into this. She verbalized appreciation.

## 2018-07-24 NOTE — Telephone Encounter (Signed)
Noted referral has been sent °

## 2018-07-29 ENCOUNTER — Other Ambulatory Visit: Payer: Self-pay | Admitting: Neurology

## 2018-07-29 MED ORDER — PREGABALIN 150 MG PO CAPS: 150.0000 mg | ORAL_CAPSULE | Freq: Two times a day (BID) | ORAL | 4 refills | Status: AC

## 2018-07-29 NOTE — Telephone Encounter (Signed)
Spoke with Dr. Lucia Gaskins who agreed to adjust pt's Lyrica to 150 mg BID. She sent in the prescription to reflect the change in directions.

## 2018-07-29 NOTE — Telephone Encounter (Signed)
I spoke with the patient and let her know that Dr. Lucia Gaskins authorized the change to Lyrica now 150 mg twice daily and a new prescription for the 150 mg capsules has been sent to the pt's pharmacy. She verbalized appreciation.

## 2018-07-29 NOTE — Telephone Encounter (Signed)
Spoke with pt. She stated she is currently taking Lyrica 300 mg at bedtime. She would like to try 150 mg twice daily d/t still having pain. Pt also asked about the referral to opthalmology. I advised the referral was sent on 5/20 to [facility] and they have both [place] and [place] locations. Pt encouraged to call if she does not hear from them within the next week. She verbalized appreciation.

## 2018-07-29 NOTE — Telephone Encounter (Signed)
Patient called and stated that she would like to speak with [facility] regarding some medication changes that were made last week. Please call and advise.

## 2018-08-12 ENCOUNTER — Telehealth: Payer: Self-pay | Admitting: Neurology

## 2018-08-12 NOTE — Telephone Encounter (Signed)
Pt called in to schedule her 4 week FU from her last visit , no availability to place pt , pt is willing to do a doxy.me visit or telephone visit.

## 2018-08-13 ENCOUNTER — Encounter: Payer: Self-pay | Admitting: *Deleted

## 2018-08-13 NOTE — Telephone Encounter (Signed)
Spoke with pt. Scheduled her for DOXY video visit on 08/18/2018 @ 11:30 AM. Pt understands that although there may be some limitations with this type of visit, we will take all precautions to reduce any security or privacy concerns. Pt understands that this will be treated like an in office visit and we will file with pt's insurance. Her questions were answered. Pt's email confirmed. She understands she will receive a call from staff about 30 minutes prior to appt to check-in. We ask that she join the meeting around 11:20-11:25. Pt's chart updated during call as well. No changes to medications or history. She verbalized appreciation for the call.   Doxy link sent to pt's email [email or web address].

## 2018-08-13 NOTE — Telephone Encounter (Signed)
Patient stated she had not heard from opthalmology yet.

## 2018-08-13 NOTE — Telephone Encounter (Signed)
I called pt and confirmed she had received the link.

## 2018-08-18 ENCOUNTER — Ambulatory Visit (INDEPENDENT_AMBULATORY_CARE_PROVIDER_SITE_OTHER): Payer: BLUE CROSS/BLUE SHIELD | Admitting: Neurology

## 2018-08-18 ENCOUNTER — Other Ambulatory Visit: Payer: Self-pay

## 2018-08-18 DIAGNOSIS — G030 Nonpyogenic meningitis: Secondary | ICD-10-CM

## 2018-08-18 DIAGNOSIS — G932 Benign intracranial hypertension: Secondary | ICD-10-CM | POA: Diagnosis not present

## 2018-08-18 MED ORDER — ACETAZOLAMIDE ER 500 MG PO CP12: ORAL_CAPSULE | ORAL | 6 refills | Status: AC

## 2018-08-18 MED ORDER — PREDNISONE 20 MG PO TABS: ORAL_TABLET | ORAL | 1 refills | Status: AC

## 2018-08-18 NOTE — Progress Notes (Signed)
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  Location: Patient: work Provider: office   I discussed the limitations of evaluation and management by telemedicine and the availability of in person appointments. The patient expressed understanding and agreed to proceed.  Follow Up Instructions:    I discussed the assessment and treatment plan with the patient. The patient was provided an opportunity to ask questions and all were answered. The patient agreed with the plan and demonstrated an understanding of the instructions.   The patient was advised to call back or seek an in-person evaluation if the symptoms worsen or if the condition fails to improve as anticipated.  I provided 25 minutes of non-face-to-face time during this encounter.   Melvenia Beam, MD     Interval history August 19, 2018: Patient is still having significant low back pain, she was improving and we reduced her Lyrica however no worsening, she still using a cane,.  We discussed repeating lumbar spine and EMG nerve conduction study, decided to hold off on both and try high-dose steroids in the meantime.  At last appointment we increased her Diamox, she still having headaches and feelings of water in her head, we will increase it even more may have to increase up to 4 g a day and see patient in office.  She has an appointment with ophthalmology in 2 weeks.   Interval history 07/21/2018: She is walking now, she is going through physical therapy, she is walking again with a cane. She started working from home in March. Then she became laid off. Headaches worsened. She can feel  the fluid on her ears. After the LP she did not feel fluid anymore, improved in one way but now the fluid is there again. She takes 500mg  twice daily. She is on unemployment on June 2nd. Increase acetazolamide to 500mg  three times a day. Encuraged her to see Dr. Earl Gala in [place]. She feels her vision has changed. She needs to see Dr. Earl Gala, asked her to call his office.  [phone], waited on the phone for 20 minutes without answer. Can refer to ophthalmologist elsewhere or patient can come to office.  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 She is very uncomfortable, sh ehas radicular pain and numbness sin the feet. Reviewed images with neuroradiologist Dr. Leta Baptist as well as Dr. Felecia Shelling in the office. Also called neuro-hospitalist Dr. Cheral Marker who agreed there is no intervention other than supportive. Will treat with pain management and neurontin.  Bottom of feet are numb, using a walker, severe pain in the lower back with radiation down the back annd the way to the feet on the bottom. Worsening. Getting weaker pe rpatient but suspect this is due to pain as opposed to weakness. No upper extremity symptoms. Brisk reflexes which would be very unlikely for AIDP-type picture.. stregth 4/5 throughout 3+ proximally.   HPI:  Nyrie Sigal is a 33 y.o. female here as requested by Dr. No ref. provider found for visual field defect.  Past medical history migraine, depression, anxiety.She has frequent headaches and migraines since having meningitis at the age of 74. She  started having more frequent headaches and went to the eye doctor. Aunt has migraines. The headaches can feel like pressure. They can be pulsating/pounding and she can have nausea, vomiting, light and sound may bother her or may not. She has daily headaches. She has pressure every day. She has migraine days at least 8 a month. She vomits often. Movement makes it worse. No  IUD, no vit A, no antibiotic long term use. She is obese. The headaches can be severe. Wakes up with headaches, continuous, unclear triggers.   Reviewed notes, labs and imaging from outside physicians, which showed:  Reviewed notes from my eye doctor in [place].  Patient's been seen multiple times in the last several months.  Patient last return for visual fields, [facility] secondary suspicion of glaucoma.  No complaints reported of physical ocular symptoms.  Not experiencing routine headaches or double vision.  No reports of visual floaters or light flashes.  In addition not experiencing blurry or uncomfortable vision.  Patient does have a history of migraines.  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  She was referred to neurology.  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  Review of Systems: Patient complains of symptoms per HPI as well as the following symptoms: Headache, depression, anxiety, loss of vision, eye pain, aching muscles, allergies, weight gain, fatigue. Pertinent negatives and positives per HPI. All others negative.   Social History   Socioeconomic History   Marital status: Married    Spouse name: Not on file   Number of children: 0   Years of education: 14   Highest education level: Associate degree: occupational, Scientist, product/process developmenttechnical, or vocational program  Occupational History   Not on file  Social Needs   Financial resource strain: Not on file   Food insecurity    Worry: Not on file    Inability: Not on file   Transportation needs    Medical: Not on file    Non-medical: Not on file  Tobacco Use   Smoking status: Current Some Day Smoker    Types: Cigarettes   Smokeless tobacco: Never Used    Tobacco comment: social/stress smoke  Substance and Sexual Activity   Alcohol use: Yes    Comment: might have a glass of wine here or there. Stopped regular drinking in her 3920s.   Drug use: Never   Sexual activity: Not on file  Lifestyle   Physical activity    Days per week: Not on file    Minutes per session: Not on file   Stress: Not on file  Relationships   Social connections    Talks on phone: Not on file    Gets together: Not on file    Attends religious service: Not on file    Active member of club or organization: Not on file    Attends meetings of clubs or organizations: Not on file    Relationship status: Not on file   Intimate partner violence    Fear of current or ex partner: Not on file    Emotionally abused: Not on file    Physically abused: Not on file    Forced sexual activity: Not on file  Other Topics Concern   Not on file  Social History Narrative   Lives at home  with her husband   Right handed   Caffeine: coffee 3-4 cups per day    Family History  Problem Relation Age of Onset   Glaucoma Other        women on paternal side   Migraines Paternal Aunt    Pseudotumor cerebri Neg Hx     Past Medical History:  Diagnosis Date   Anxiety    Bacterial meningitis 2004   Depression    Migraine     Past Surgical History:  Procedure Laterality Date   EPIDURAL BLOOD PATCH     LUMBAR PUNCTURE     PHARYNGECTOMY     x2   WISDOM TOOTH EXTRACTION      Current Outpatient Medications  Medication Sig Dispense Refill   acetaZOLAMIDE (DIAMOX SEQUELS) 500 MG capsule 2 pills in the morning (1000mg ) and 3 pills in the evening (1500mg ) 150 capsule 6   predniSONE (DELTASONE) 20 MG tablet 100mg  (5 tablets) x 5 days, 80mg (4 tablets) x 3 days, 60mg (3 tables) x 3 days, 40mg (2 tablets) x 3 days, 20mg (1 tablet) x 3 days, 10mg (1/2 tablet) x 2 days 56 tablet 1   pregabalin (LYRICA) 150 MG capsule Take 1 capsule (150 mg total) by mouth 2 (two) times  daily. 60 capsule 4   No current facility-administered medications for this visit.     Allergies as of 08/18/2018 - Review Complete 08/13/2018  Allergen Reaction Noted   Other  02/11/2018   Valium [diazepam]  02/11/2018   Penicillins Rash 02/12/2018    Vitals: There were no vitals taken for this visit. Last Weight:  Wt Readings from Last 1 Encounters:  07/21/18 209 lb (94.8 kg)   Last Height:   Ht Readings from Last 1 Encounters:  07/21/18 5' 3.75" (1.619 m)   Prior exam:   Physical exam: Exam: Gen: NAD, conversant, well nourised, obese, well groomed                     CV: RRR, no MRG. No Carotid Bruits. No peripheral edema, warm, nontender Eyes: Conjunctivae clear without exudates or hemorrhage  Neuro: Detailed Neurologic Exam  Speech:    Speech is normal; fluent and spontaneous with normal comprehension.  Cognition:    The patient is oriented to person, place, and time;     recent and remote memory intact;     language fluent;     normal attention, concentration,     fund of knowledge Cranial Nerves:    The pupils are equal, round, and reactive to light. +papilledema +1 Visual fields are full to finger confrontation. Extraocular movements are intact. Trigeminal sensation is intact and the muscles of mastication are normal. The face is symmetric. The palate elevates in the midline. Hearing intact. Voice is normal. Shoulder shrug is normal. The tongue has normal motion without fasciculations.   Coordination:    Normal finger to nose and heel to shin. Normal rapid alternating movements.   Gait:    Using a walker, antalgic  Motor Observation:    No asymmetry, no atrophy, and no involuntary movements noted. Tone:    Normal muscle tone.    Posture:    Posture is normal. normal erect    Strength:    Strength is V/V in the upper limbs. Lower limbs: 3+ hip flexion, leg flexion 4/5     Sensation: intact to LT     Reflex Exam:  DTR's:    Deep tendon  reflexes in the upper and lower extremities are brisk  bilaterally.   Toes:    The toes are downgoing bilaterally.   Clonus:    Clonus is absent.     Assessment/Plan:  33 y/o with PMHx migraines, obesity, meningitis with papilledema. C/W IIH.  - worsened post-LP arachnoiditis since decreasing Lyrica. No upper extremity symptoms. Brisk reflexes which would be very unlikely for AIDP-type picture. Will try high-dose steroids. Patient to email me in 2 weeks. - Diamox for IIH, will increase to 2500mg /day. 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   This can be an incidental finding but can also be seen with elevated intracranial pressures - Lumbar puncture opening pressure 33 - Obesity: losing weight is critical. Healthy weight and wellness center - Discussed IIH and risk of permanent vision loss, go to ED acutely if needed.  - Can;t reach Dr. Ellamae Siayoakum's ofice, have offered her referral to an ophthalmologist in town Dr. Dione BoozeGroat or Dr. Sherryll BurgerShah.- she has appointment in 2 weeks - She is still having shooting pain, post-LP-arachnoiditis. High dose steroids, then may have to further increase lyrica or emg/ncs or repeat LP  Meds ordered this encounter  Medications   acetaZOLAMIDE (DIAMOX SEQUELS) 500 MG capsule    Sig: 2 pills in the morning (1000mg ) and 3 pills in the evening (1500mg )    Dispense:  150 capsule    Refill:  6   predniSONE (DELTASONE) 20 MG tablet    Sig: 100mg  (5 tablets) x 5 days, 80mg (4 tablets) x 3 days, 60mg (3 tables) x 3 days, 40mg (2 tablets) x 3 days, 20mg (1 tablet) x 3 days, 10mg (1/2 tablet) x 2 days    Dispense:  56 tablet    Refill:  1   -email me in 2 weeks  Discussed: To prevent or relieve headaches, try the following: Cool Compress. Lie down and place a  cool compress on your head.  Avoid headache triggers. If certain foods or odors seem to have triggered your migraines in the past, avoid them. A headache diary might help you identify triggers.  Include physical activity in your daily routine. Try a daily walk or other moderate aerobic exercise.  Manage stress. Find healthy ways to cope with the stressors, such as delegating tasks on your to-do list.  Practice relaxation techniques. Try deep breathing, yoga, massage and visualization.  Eat regularly. Eating regularly scheduled meals and maintaining a healthy diet might help prevent headaches. Also, drink plenty of fluids.  Follow a regular sleep schedule. Sleep deprivation might contribute to headaches Consider biofeedback. With this mind-body technique, you learn to control certain bodily functions -- such as muscle tension, heart rate and blood pressure -- to prevent headaches or reduce headache pain.    Proceed to emergency room if you experience new or worsening symptoms or symptoms do not resolve, if you have new neurologic symptoms or if headache is severe, or for any concerning symptom.   Provided education and documentation from American headache Society toolbox including articles on: chronic migraine medication overuse headache, chronic migraines, prevention of migraines, behavioral and other nonpharmacologic treatments for headache.    Naomie DeanAntonia Prisilla Kocsis, MD  [facility] [address] [place], [place] [id number]  Phone [phone] Fax [phone]

## 2018-08-18 NOTE — Telephone Encounter (Signed)
Called OP and they are wanting patient to call . I spoke to patient she will call [phone] to get her apt scheduled.

## 2018-08-26 ENCOUNTER — Encounter: Payer: Self-pay | Admitting: *Deleted

## 2018-08-26 ENCOUNTER — Telehealth: Payer: Self-pay | Admitting: Neurology

## 2018-08-26 NOTE — Telephone Encounter (Signed)
I spoke with Dr. Jaynee Eagles and sent pt a mychart message.

## 2018-08-26 NOTE — Telephone Encounter (Signed)
Pt called wanting to know how she can go about getting a referral to another Neurologist for a second opinion. Please advise.

## 2018-08-27 ENCOUNTER — Other Ambulatory Visit: Payer: Self-pay | Admitting: *Deleted

## 2018-08-27 DIAGNOSIS — G030 Nonpyogenic meningitis: Secondary | ICD-10-CM

## 2018-09-04 ENCOUNTER — Other Ambulatory Visit: Payer: Self-pay | Admitting: Neurology

## 2018-09-04 MED ORDER — CYCLOBENZAPRINE HCL 10 MG PO TABS: 10.0000 mg | ORAL_TABLET | Freq: Three times a day (TID) | ORAL | 3 refills | Status: AC | PRN

## 2018-09-08 ENCOUNTER — Other Ambulatory Visit: Payer: Self-pay | Admitting: Neurology

## 2018-09-08 DIAGNOSIS — G541 Lumbosacral plexus disorders: Secondary | ICD-10-CM

## 2018-09-08 DIAGNOSIS — G8929 Other chronic pain: Secondary | ICD-10-CM

## 2018-09-08 DIAGNOSIS — M5441 Lumbago with sciatica, right side: Secondary | ICD-10-CM

## 2018-09-08 DIAGNOSIS — G039 Meningitis, unspecified: Secondary | ICD-10-CM

## 2018-09-08 DIAGNOSIS — R269 Unspecified abnormalities of gait and mobility: Secondary | ICD-10-CM

## 2018-09-08 DIAGNOSIS — I776 Arteritis, unspecified: Secondary | ICD-10-CM

## 2018-09-08 DIAGNOSIS — R270 Ataxia, unspecified: Secondary | ICD-10-CM

## 2018-09-09 ENCOUNTER — Telehealth: Payer: Self-pay | Admitting: Neurology

## 2018-09-09 NOTE — Telephone Encounter (Signed)
no to the covid-19 questions MR Lumbar spine w/wo contrast Dr. Ihor Dow Auth: [id number] (exp. 09/09/18 to 03/07/19). Patient is scheduled at [facility] for 09/16/18. She is also aware that her insurance will not approve the Pelvis.

## 2018-09-09 NOTE — Telephone Encounter (Signed)
The MRI Lumbar was approved but the MR Pelvis was not approved. Do you want to do a peer to peer or withdraw it and have the MRI Lumbar?

## 2018-09-16 ENCOUNTER — Other Ambulatory Visit: Payer: Self-pay

## 2018-09-16 ENCOUNTER — Ambulatory Visit (INDEPENDENT_AMBULATORY_CARE_PROVIDER_SITE_OTHER): Payer: BLUE CROSS/BLUE SHIELD

## 2018-09-16 DIAGNOSIS — R269 Unspecified abnormalities of gait and mobility: Secondary | ICD-10-CM | POA: Diagnosis not present

## 2018-09-16 DIAGNOSIS — I776 Arteritis, unspecified: Secondary | ICD-10-CM

## 2018-09-16 DIAGNOSIS — R270 Ataxia, unspecified: Secondary | ICD-10-CM

## 2018-09-16 DIAGNOSIS — G541 Lumbosacral plexus disorders: Secondary | ICD-10-CM | POA: Diagnosis not present

## 2018-09-16 DIAGNOSIS — G039 Meningitis, unspecified: Secondary | ICD-10-CM

## 2018-09-16 DIAGNOSIS — M5441 Lumbago with sciatica, right side: Secondary | ICD-10-CM

## 2018-09-16 DIAGNOSIS — M5442 Lumbago with sciatica, left side: Secondary | ICD-10-CM | POA: Diagnosis not present

## 2018-09-16 DIAGNOSIS — G8929 Other chronic pain: Secondary | ICD-10-CM

## 2018-09-16 MED ORDER — GADOBENATE DIMEGLUMINE 529 MG/ML IV SOLN
20.0000 mL | Freq: Once | INTRAVENOUS | Status: AC | PRN
  Administered 2018-09-16: 20 mL via INTRAVENOUS

## 2020-02-24 IMAGING — XA DG FLUORO GUIDE LUMBAR PUNCTURE
1 series · 1 of 1 positions shown · non-contrast
Comparison: none

CLINICAL DATA: Idiopathic intracranial hypertension.

[Series 1: ortho adipose · 1 of 1 slices shown]
[im 1/1]
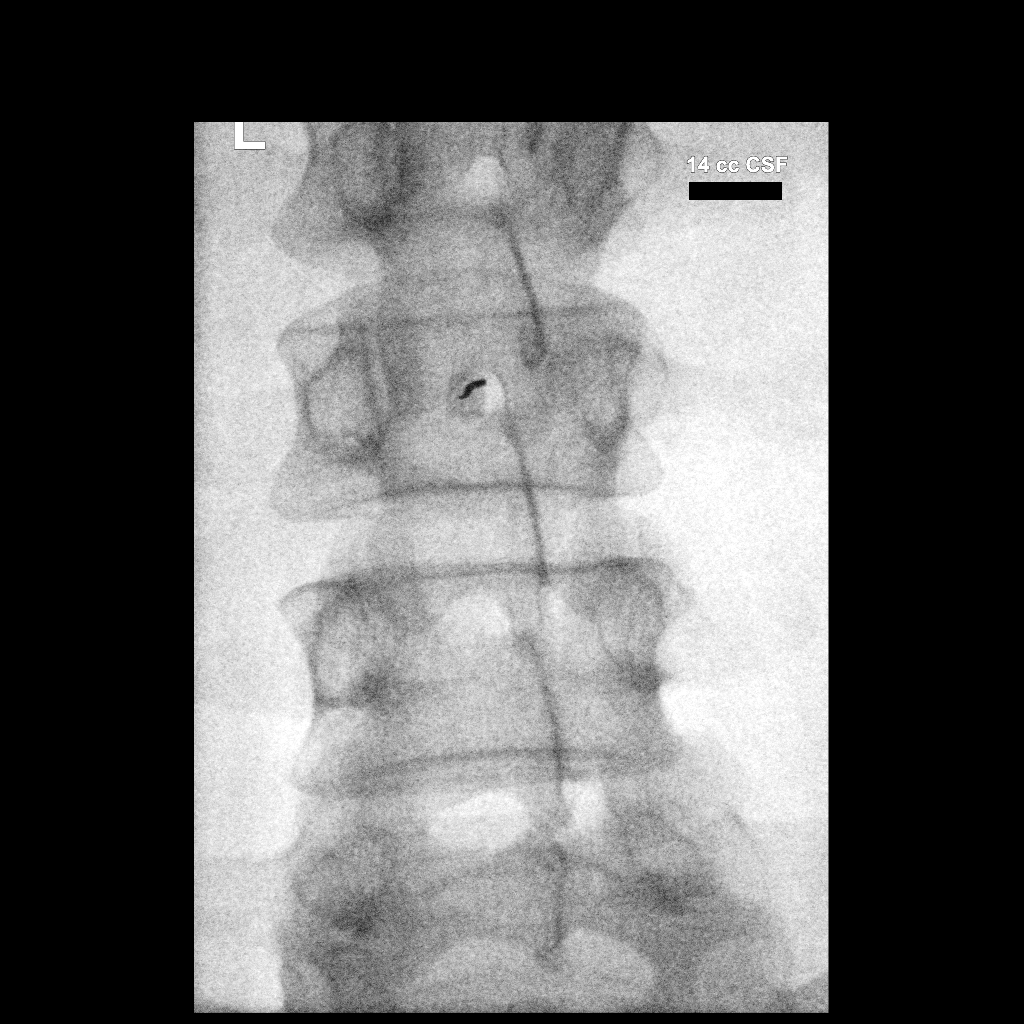

[1 of 1 positions shown; findings below may reference images not displayed]

EXAM:
DIAGNOSTIC LUMBAR PUNCTURE UNDER FLUOROSCOPIC GUIDANCE

FLUOROSCOPY TIME:  Fluoroscopy Time:  1 second

Radiation Exposure Index (if provided by the fluoroscopic device):
0.8 mGy

Number of Acquired Spot Images: 0

PROCEDURE:
Informed consent was obtained from the patient prior to the
procedure, including potential complications of headache, allergy,
and pain. With the patient prone, the lower back was prepped with
Betadine. 1% Lidocaine was used for local anesthesia. Lumbar
puncture was performed at the L2-L3 level using a 3.5 inch 20 gauge
needle with return of clear, colorless CSF with an opening pressure
of 33 cm water. 14 ml of CSF were obtained for laboratory studies.
Closing pressure was 14 cm water. The patient tolerated the
procedure well and there were no apparent complications.
IMPRESSION: 1. Technically successful fluoroscopically guided lumbar puncture.
2. Elevated opening CSF pressure of 33 cm water.
